# Patient Record
Sex: Female | Born: 1980 | Race: Black or African American | Hispanic: No | Marital: Single | Smoking: Former smoker
Health system: Southern US, Community
[De-identification: ages and names within clinical notes are randomized; demographics above are authoritative.]

## PROBLEM LIST (undated history)

## (undated) DIAGNOSIS — G510 Bell's palsy: Secondary | ICD-10-CM

## (undated) HISTORY — DX: Bell's palsy: G51.0

---
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
## 1999-03-05 ENCOUNTER — Encounter: Payer: Self-pay | Admitting: Emergency Medicine

## 1999-03-05 ENCOUNTER — Emergency Department (HOSPITAL_COMMUNITY): Admission: EM | Admit: 1999-03-05 | Discharge: 1999-03-05 | Payer: Self-pay | Admitting: Emergency Medicine

## 1999-06-23 ENCOUNTER — Encounter: Admission: RE | Admit: 1999-06-23 | Discharge: 1999-06-23 | Payer: Self-pay | Admitting: Family Medicine

## 1999-06-23 ENCOUNTER — Other Ambulatory Visit: Admission: RE | Admit: 1999-06-23 | Discharge: 1999-06-23 | Payer: Self-pay

## 1999-10-01 ENCOUNTER — Encounter: Admission: RE | Admit: 1999-10-01 | Discharge: 1999-10-01 | Payer: Self-pay | Admitting: Family Medicine

## 2000-08-19 ENCOUNTER — Other Ambulatory Visit: Admission: RE | Admit: 2000-08-19 | Discharge: 2000-08-19 | Payer: Self-pay | Admitting: *Deleted

## 2000-08-19 ENCOUNTER — Encounter: Admission: RE | Admit: 2000-08-19 | Discharge: 2000-08-19 | Payer: Self-pay | Admitting: Family Medicine

## 2000-08-23 ENCOUNTER — Encounter: Admission: RE | Admit: 2000-08-23 | Discharge: 2000-08-23 | Payer: Self-pay | Admitting: *Deleted

## 2000-08-23 ENCOUNTER — Encounter: Payer: Self-pay | Admitting: *Deleted

## 2000-08-31 ENCOUNTER — Encounter: Admission: RE | Admit: 2000-08-31 | Discharge: 2000-08-31 | Payer: Self-pay | Admitting: Family Medicine

## 2000-09-05 ENCOUNTER — Other Ambulatory Visit: Admission: RE | Admit: 2000-09-05 | Discharge: 2000-09-05 | Payer: Self-pay | Admitting: Family Medicine

## 2000-09-06 ENCOUNTER — Encounter: Admission: RE | Admit: 2000-09-06 | Discharge: 2000-09-06 | Payer: Self-pay | Admitting: Family Medicine

## 2000-09-21 ENCOUNTER — Encounter: Admission: RE | Admit: 2000-09-21 | Discharge: 2000-09-21 | Payer: Self-pay | Admitting: Family Medicine

## 2001-02-22 ENCOUNTER — Other Ambulatory Visit: Admission: RE | Admit: 2001-02-22 | Discharge: 2001-02-22 | Payer: Self-pay | Admitting: *Deleted

## 2001-02-22 ENCOUNTER — Encounter: Admission: RE | Admit: 2001-02-22 | Discharge: 2001-02-22 | Payer: Self-pay | Admitting: Family Medicine

## 2001-03-29 ENCOUNTER — Encounter: Admission: RE | Admit: 2001-03-29 | Discharge: 2001-03-29 | Payer: Self-pay | Admitting: Family Medicine

## 2001-04-05 ENCOUNTER — Encounter: Admission: RE | Admit: 2001-04-05 | Discharge: 2001-04-05 | Payer: Self-pay | Admitting: Family Medicine

## 2001-06-09 ENCOUNTER — Encounter: Admission: RE | Admit: 2001-06-09 | Discharge: 2001-06-09 | Payer: Self-pay | Admitting: Family Medicine

## 2001-11-08 ENCOUNTER — Ambulatory Visit (HOSPITAL_COMMUNITY): Admission: RE | Admit: 2001-11-08 | Discharge: 2001-11-08 | Payer: Self-pay | Admitting: *Deleted

## 2002-01-29 ENCOUNTER — Inpatient Hospital Stay (HOSPITAL_COMMUNITY): Admission: AD | Admit: 2002-01-29 | Discharge: 2002-01-29 | Payer: Self-pay | Admitting: *Deleted

## 2002-02-15 ENCOUNTER — Inpatient Hospital Stay (HOSPITAL_COMMUNITY): Admission: AD | Admit: 2002-02-15 | Discharge: 2002-02-15 | Payer: Self-pay | Admitting: *Deleted

## 2002-03-14 ENCOUNTER — Inpatient Hospital Stay (HOSPITAL_COMMUNITY): Admission: AD | Admit: 2002-03-14 | Discharge: 2002-03-14 | Payer: Self-pay | Admitting: Obstetrics and Gynecology

## 2002-03-23 ENCOUNTER — Inpatient Hospital Stay (HOSPITAL_COMMUNITY): Admission: AD | Admit: 2002-03-23 | Discharge: 2002-03-23 | Payer: Self-pay | Admitting: *Deleted

## 2002-03-25 ENCOUNTER — Inpatient Hospital Stay (HOSPITAL_COMMUNITY): Admission: AD | Admit: 2002-03-25 | Discharge: 2002-03-30 | Payer: Self-pay | Admitting: *Deleted

## 2002-03-26 ENCOUNTER — Encounter: Payer: Self-pay | Admitting: *Deleted

## 2002-04-02 ENCOUNTER — Encounter: Admission: RE | Admit: 2002-04-02 | Discharge: 2002-05-02 | Payer: Self-pay | Admitting: *Deleted

## 2002-05-18 ENCOUNTER — Emergency Department (HOSPITAL_COMMUNITY): Admission: EM | Admit: 2002-05-18 | Discharge: 2002-05-18 | Payer: Self-pay | Admitting: Emergency Medicine

## 2002-09-25 ENCOUNTER — Encounter: Admission: RE | Admit: 2002-09-25 | Discharge: 2002-09-25 | Payer: Self-pay | Admitting: Family Medicine

## 2002-10-29 ENCOUNTER — Encounter: Admission: RE | Admit: 2002-10-29 | Discharge: 2002-10-29 | Payer: Self-pay | Admitting: Family Medicine

## 2003-02-26 ENCOUNTER — Encounter: Admission: RE | Admit: 2003-02-26 | Discharge: 2003-02-26 | Payer: Self-pay | Admitting: Family Medicine

## 2003-03-20 ENCOUNTER — Encounter: Admission: RE | Admit: 2003-03-20 | Discharge: 2003-03-20 | Payer: Self-pay | Admitting: Family Medicine

## 2003-04-30 ENCOUNTER — Encounter: Admission: RE | Admit: 2003-04-30 | Discharge: 2003-04-30 | Payer: Self-pay | Admitting: Family Medicine

## 2003-05-08 ENCOUNTER — Ambulatory Visit (HOSPITAL_COMMUNITY): Admission: RE | Admit: 2003-05-08 | Discharge: 2003-05-08 | Payer: Self-pay | Admitting: Family Medicine

## 2003-05-31 ENCOUNTER — Encounter: Admission: RE | Admit: 2003-05-31 | Discharge: 2003-05-31 | Payer: Self-pay | Admitting: Family Medicine

## 2003-06-28 ENCOUNTER — Encounter: Admission: RE | Admit: 2003-06-28 | Discharge: 2003-06-28 | Payer: Self-pay | Admitting: Family Medicine

## 2003-08-02 ENCOUNTER — Encounter: Admission: RE | Admit: 2003-08-02 | Discharge: 2003-08-02 | Payer: Self-pay | Admitting: Family Medicine

## 2003-09-02 ENCOUNTER — Encounter: Admission: RE | Admit: 2003-09-02 | Discharge: 2003-09-02 | Payer: Self-pay | Admitting: Family Medicine

## 2003-09-17 ENCOUNTER — Encounter: Admission: RE | Admit: 2003-09-17 | Discharge: 2003-09-17 | Payer: Self-pay | Admitting: Family Medicine

## 2003-09-26 ENCOUNTER — Encounter: Admission: RE | Admit: 2003-09-26 | Discharge: 2003-09-26 | Payer: Self-pay | Admitting: Sports Medicine

## 2003-10-03 ENCOUNTER — Encounter: Admission: RE | Admit: 2003-10-03 | Discharge: 2003-10-03 | Payer: Self-pay | Admitting: Sports Medicine

## 2003-10-07 ENCOUNTER — Encounter: Admission: RE | Admit: 2003-10-07 | Discharge: 2003-10-07 | Payer: Self-pay | Admitting: Family Medicine

## 2003-10-08 ENCOUNTER — Inpatient Hospital Stay (HOSPITAL_COMMUNITY): Admission: AD | Admit: 2003-10-08 | Discharge: 2003-10-08 | Payer: Self-pay | Admitting: *Deleted

## 2003-10-08 ENCOUNTER — Encounter: Admission: RE | Admit: 2003-10-08 | Discharge: 2003-10-08 | Payer: Self-pay | Admitting: Family Medicine

## 2003-10-10 ENCOUNTER — Inpatient Hospital Stay (HOSPITAL_COMMUNITY): Admission: AD | Admit: 2003-10-10 | Discharge: 2003-10-12 | Payer: Self-pay | Admitting: Obstetrics and Gynecology

## 2004-01-07 ENCOUNTER — Encounter: Admission: RE | Admit: 2004-01-07 | Discharge: 2004-01-07 | Payer: Self-pay | Admitting: Obstetrics and Gynecology

## 2004-01-21 ENCOUNTER — Encounter: Admission: RE | Admit: 2004-01-21 | Discharge: 2004-01-21 | Payer: Self-pay | Admitting: Obstetrics & Gynecology

## 2004-11-06 ENCOUNTER — Other Ambulatory Visit: Admission: RE | Admit: 2004-11-06 | Discharge: 2004-11-06 | Payer: Self-pay | Admitting: Family Medicine

## 2004-11-06 ENCOUNTER — Ambulatory Visit: Payer: Self-pay | Admitting: Family Medicine

## 2004-11-06 ENCOUNTER — Encounter (INDEPENDENT_AMBULATORY_CARE_PROVIDER_SITE_OTHER): Payer: Self-pay | Admitting: Specialist

## 2005-02-24 ENCOUNTER — Ambulatory Visit: Payer: Self-pay | Admitting: Family Medicine

## 2005-05-07 ENCOUNTER — Ambulatory Visit: Payer: Self-pay | Admitting: Family Medicine

## 2005-05-07 ENCOUNTER — Encounter (INDEPENDENT_AMBULATORY_CARE_PROVIDER_SITE_OTHER): Payer: Self-pay | Admitting: Specialist

## 2005-05-07 ENCOUNTER — Other Ambulatory Visit: Admission: RE | Admit: 2005-05-07 | Discharge: 2005-05-07 | Payer: Self-pay | Admitting: Family Medicine

## 2005-11-24 ENCOUNTER — Ambulatory Visit: Payer: Self-pay | Admitting: Family Medicine

## 2005-11-29 ENCOUNTER — Ambulatory Visit (HOSPITAL_COMMUNITY): Admission: RE | Admit: 2005-11-29 | Discharge: 2005-11-29 | Payer: Self-pay | Admitting: *Deleted

## 2006-01-07 ENCOUNTER — Ambulatory Visit: Payer: Self-pay | Admitting: Sports Medicine

## 2006-02-03 ENCOUNTER — Ambulatory Visit: Payer: Self-pay | Admitting: Family Medicine

## 2006-02-15 ENCOUNTER — Ambulatory Visit: Payer: Self-pay | Admitting: Sports Medicine

## 2006-02-17 ENCOUNTER — Ambulatory Visit: Payer: Self-pay | Admitting: Family Medicine

## 2006-03-21 ENCOUNTER — Ambulatory Visit: Payer: Self-pay | Admitting: Family Medicine

## 2006-04-18 IMAGING — US US TRANSVAGINAL NON-OB
1 series · 18 of 25 positions shown · non-contrast
Comparison: none

CLINICAL DATA: Evaluate IUD placement.
TRANSABDOMINAL AND ENDOVAGINAL PELVIC ULTRASOUND:

[Series 1: us transvaginal non-ob · 18 of 47 slices shown]
[im 1/47]
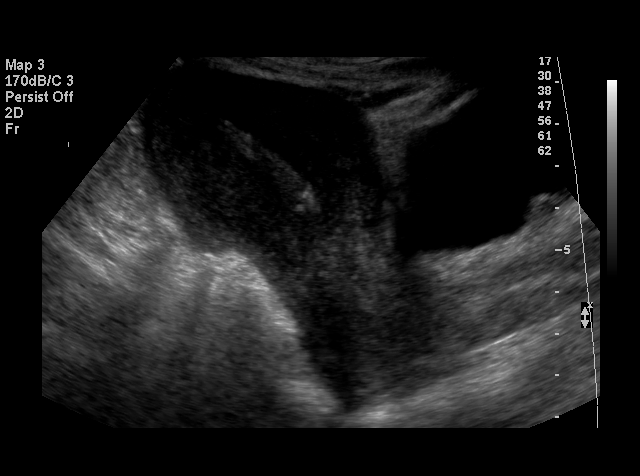
[im 4/47]
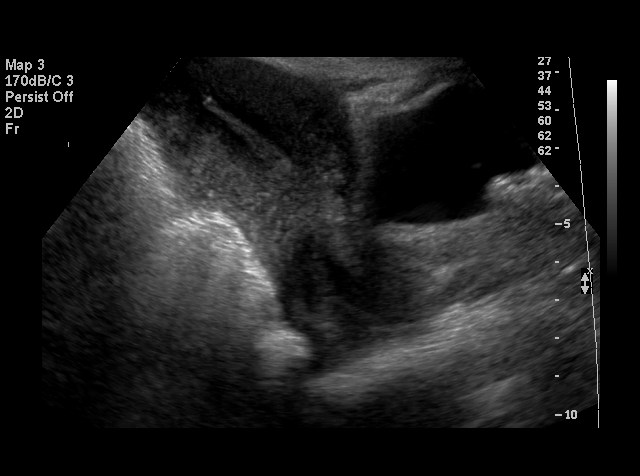
[im 6/47]
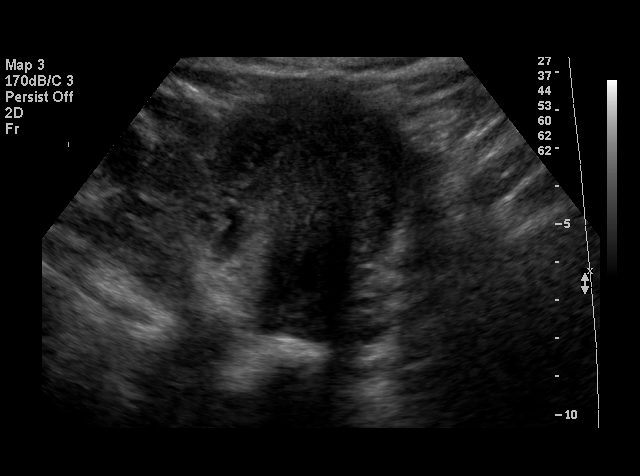
[im 8/47]
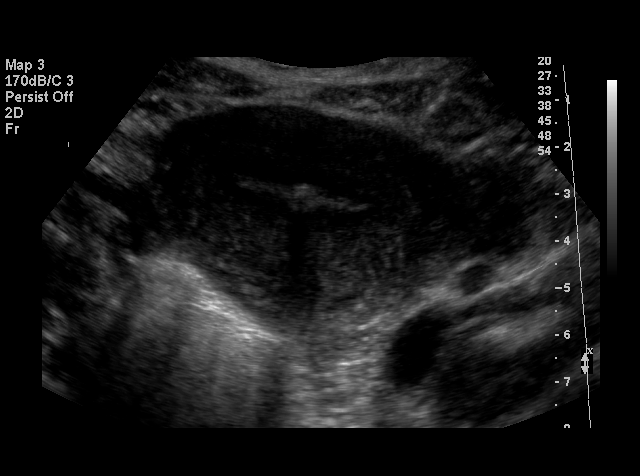
[im 12/47]
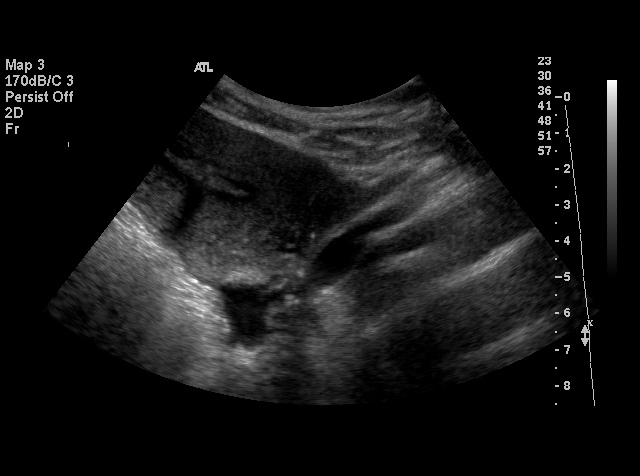
[im 14/47]
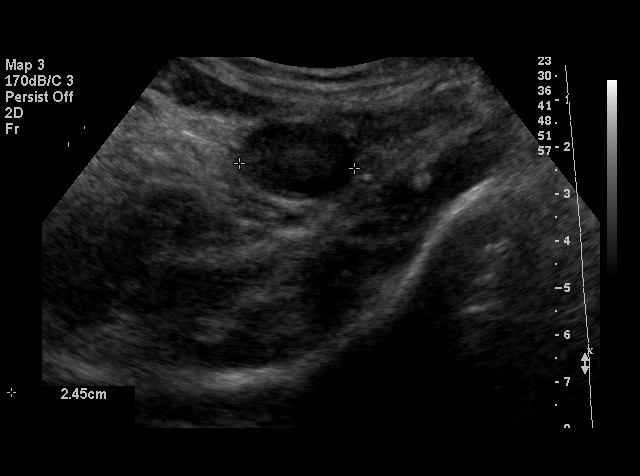
[im 18/47]
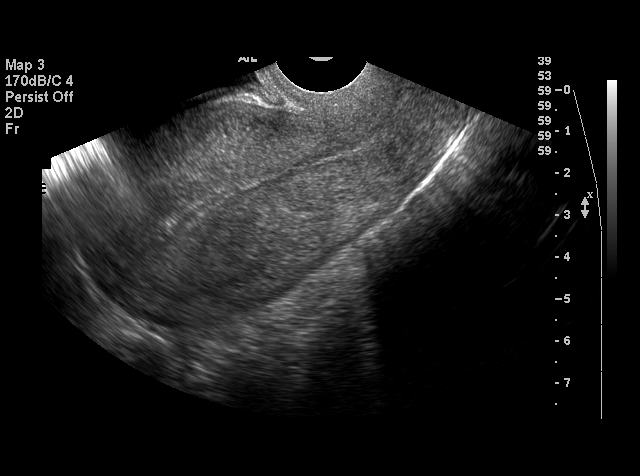
[im 20/47]
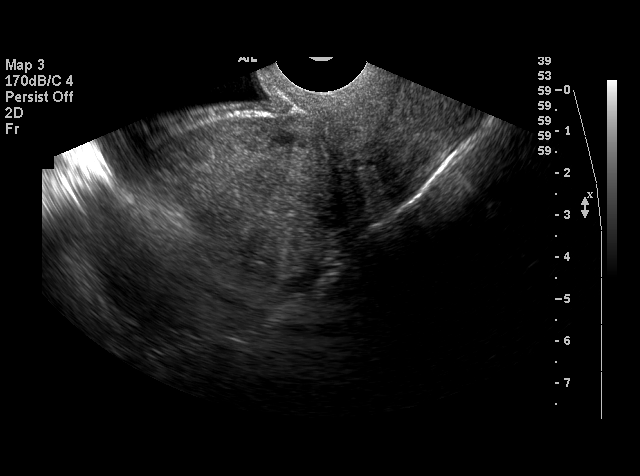
[im 22/47]
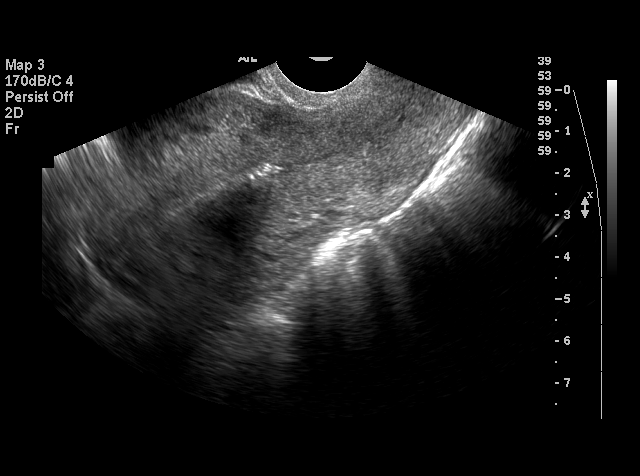
[im 25/47]
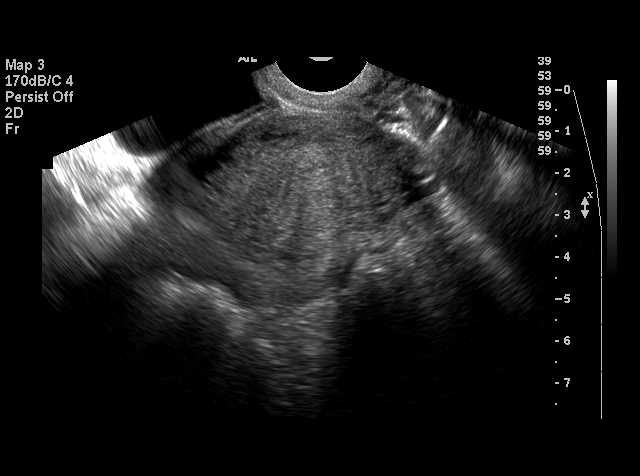
[im 27/47]
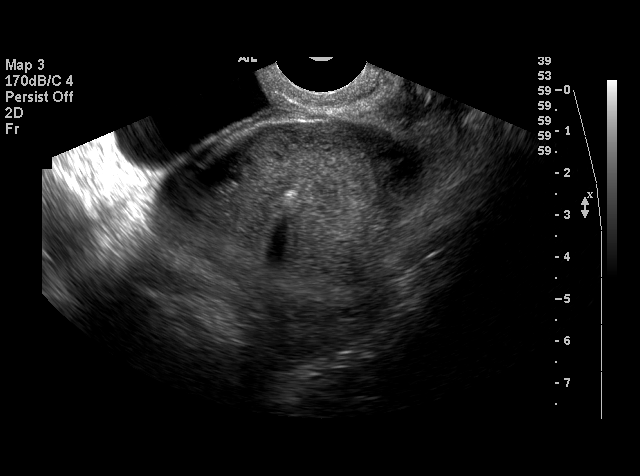
[im 29/47]
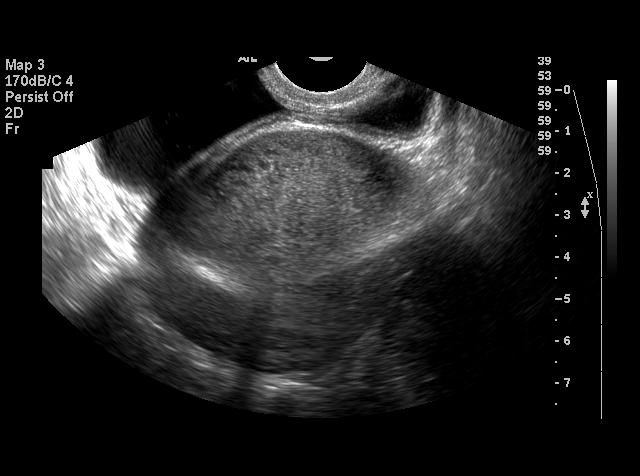
[im 33/47]
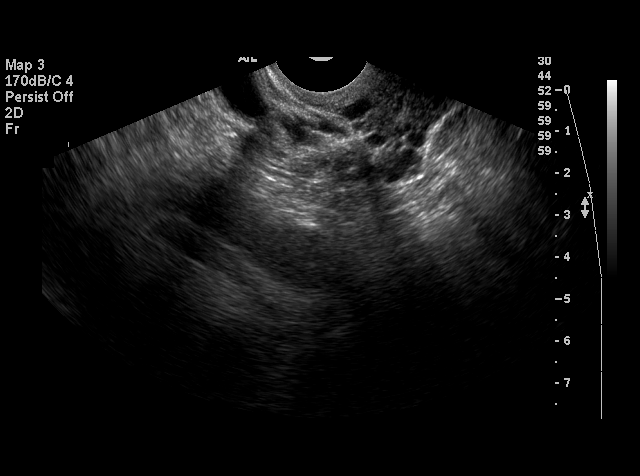
[im 35/47]
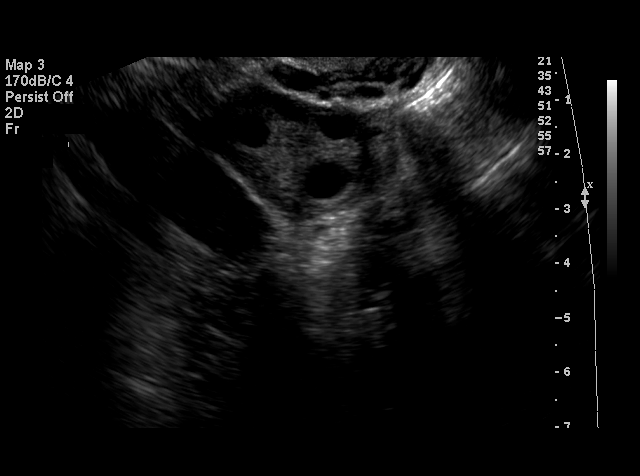
[im 39/47]
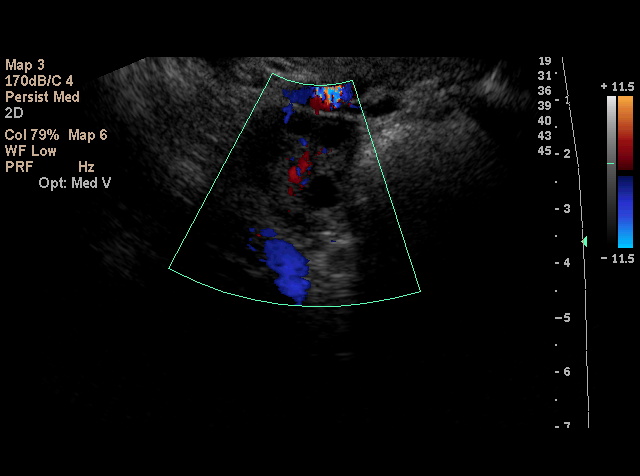
[im 41/47]
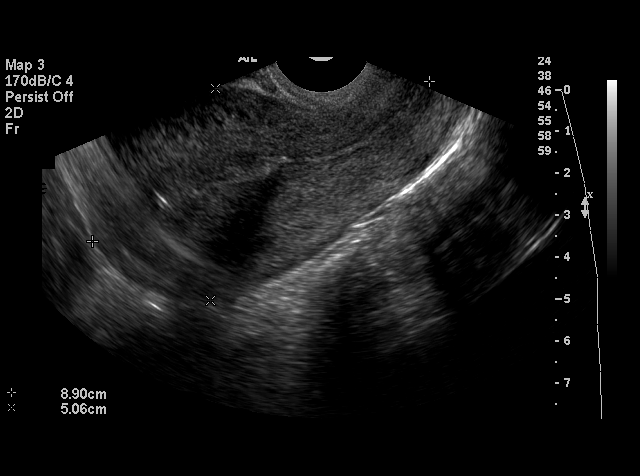
[im 43/47]
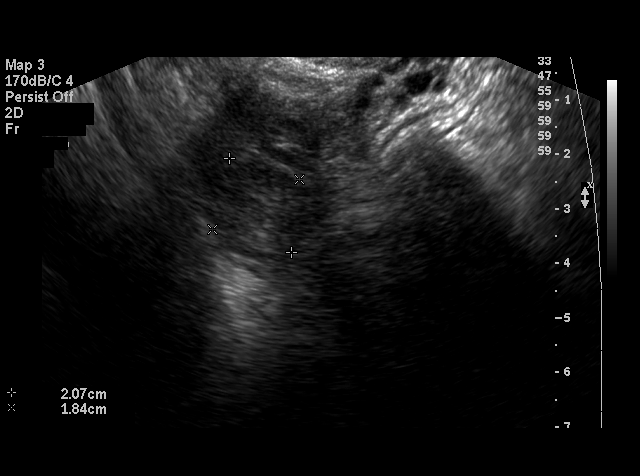
[im 47/47]
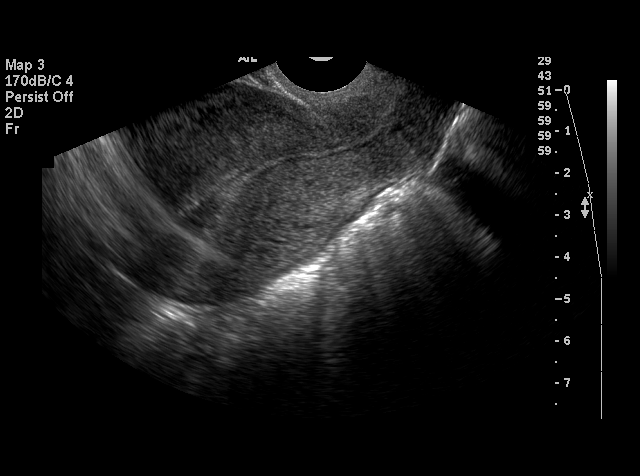

[18 of 25 positions shown; findings below may reference images not displayed]

FINDINGS: Multiple images of the uterus and adnexa were obtained using a transabdominal and endovaginal approaches.
FINDINGS: The uterus has a sagittal length of 8.9 cm, AP width of 5.1 cm and a transverse width of 5.7 cm.  A homogeneous uterine myometrium is seen.   Within the central fundal portion of the endometrial canal there is an IUD in place and this obscures evaluation of this portion of the endometrium.  The lateral fundal aspect of the endometrium measures 6.6 mm in AP width.  No areas of focal thickening or inhomogeniety are suggested.
Both ovaries are seen and have a normal appearance with the left ovary measuring 2.7 x 1.9 x 2.5 cm and the right ovary measuring 1.8 x 3.0 x 2.0 cm. No cul-de-sac or periovarian fluid is seen and no separate adnexal masses are noted.
IMPRESSION: Normal uterus and ovaries.  The patient?s IUD is in good position within the endometrial canal.  No definite endometrial abnormalities are apparent.

## 2006-04-21 ENCOUNTER — Emergency Department (HOSPITAL_COMMUNITY): Admission: EM | Admit: 2006-04-21 | Discharge: 2006-04-21 | Payer: Self-pay | Admitting: Family Medicine

## 2006-05-06 ENCOUNTER — Ambulatory Visit: Payer: Self-pay | Admitting: Family Medicine

## 2006-06-07 ENCOUNTER — Emergency Department (HOSPITAL_COMMUNITY): Admission: EM | Admit: 2006-06-07 | Discharge: 2006-06-07 | Payer: Self-pay | Admitting: Emergency Medicine

## 2006-06-15 ENCOUNTER — Ambulatory Visit: Payer: Self-pay | Admitting: Sports Medicine

## 2006-07-23 ENCOUNTER — Encounter (INDEPENDENT_AMBULATORY_CARE_PROVIDER_SITE_OTHER): Payer: Self-pay | Admitting: *Deleted

## 2006-08-01 ENCOUNTER — Other Ambulatory Visit: Admission: RE | Admit: 2006-08-01 | Discharge: 2006-08-01 | Payer: Self-pay | Admitting: Family Medicine

## 2006-08-01 ENCOUNTER — Ambulatory Visit: Payer: Self-pay | Admitting: Sports Medicine

## 2006-08-01 ENCOUNTER — Encounter (INDEPENDENT_AMBULATORY_CARE_PROVIDER_SITE_OTHER): Payer: Self-pay | Admitting: *Deleted

## 2006-08-03 ENCOUNTER — Ambulatory Visit: Payer: Self-pay | Admitting: Sports Medicine

## 2006-08-09 ENCOUNTER — Ambulatory Visit: Payer: Self-pay | Admitting: Family Medicine

## 2006-09-08 ENCOUNTER — Emergency Department (HOSPITAL_COMMUNITY): Admission: EM | Admit: 2006-09-08 | Discharge: 2006-09-08 | Payer: Self-pay | Admitting: Family Medicine

## 2006-09-12 ENCOUNTER — Ambulatory Visit: Payer: Self-pay | Admitting: Family Medicine

## 2006-10-28 ENCOUNTER — Ambulatory Visit: Payer: Self-pay | Admitting: Family Medicine

## 2006-12-26 ENCOUNTER — Ambulatory Visit: Payer: Self-pay | Admitting: Family Medicine

## 2007-01-20 ENCOUNTER — Encounter (INDEPENDENT_AMBULATORY_CARE_PROVIDER_SITE_OTHER): Payer: Self-pay | Admitting: *Deleted

## 2007-01-25 ENCOUNTER — Ambulatory Visit: Payer: Self-pay | Admitting: Family Medicine

## 2007-04-24 ENCOUNTER — Telehealth: Payer: Self-pay | Admitting: *Deleted

## 2007-05-08 ENCOUNTER — Ambulatory Visit: Payer: Self-pay | Admitting: Family Medicine

## 2007-05-08 ENCOUNTER — Telehealth: Payer: Self-pay | Admitting: *Deleted

## 2007-05-12 ENCOUNTER — Ambulatory Visit: Payer: Self-pay | Admitting: Family Medicine

## 2007-05-12 ENCOUNTER — Encounter (INDEPENDENT_AMBULATORY_CARE_PROVIDER_SITE_OTHER): Payer: Self-pay | Admitting: Family Medicine

## 2007-05-12 LAB — CONVERTED CEMR LAB
Basophils Absolute: 0 10*3/uL (ref 0.0–0.1)
Basophils Relative: 0 % (ref 0–1)
Beta hcg, urine, semiquantitative: POSITIVE
Eosinophils Absolute: 0.2 10*3/uL (ref 0.0–0.7)
Hepatitis B Surface Ag: NEGATIVE
MCHC: 34.2 g/dL (ref 30.0–36.0)
MCV: 90.2 fL (ref 78.0–100.0)
Neutro Abs: 5.6 10*3/uL (ref 1.7–7.7)
Neutrophils Relative %: 73 % (ref 43–77)
Platelets: 262 10*3/uL (ref 150–400)
Rubella: 52.7 intl units/mL — ABNORMAL HIGH

## 2007-05-19 ENCOUNTER — Telehealth (INDEPENDENT_AMBULATORY_CARE_PROVIDER_SITE_OTHER): Payer: Self-pay | Admitting: *Deleted

## 2007-06-05 ENCOUNTER — Encounter (INDEPENDENT_AMBULATORY_CARE_PROVIDER_SITE_OTHER): Payer: Self-pay | Admitting: Family Medicine

## 2007-06-05 ENCOUNTER — Other Ambulatory Visit: Admission: RE | Admit: 2007-06-05 | Discharge: 2007-06-05 | Payer: Self-pay | Admitting: Family Medicine

## 2007-06-05 ENCOUNTER — Ambulatory Visit: Payer: Self-pay | Admitting: Family Medicine

## 2007-06-05 LAB — CONVERTED CEMR LAB: GC Probe Amp, Genital: NEGATIVE

## 2007-06-06 ENCOUNTER — Encounter (INDEPENDENT_AMBULATORY_CARE_PROVIDER_SITE_OTHER): Payer: Self-pay | Admitting: Family Medicine

## 2007-06-06 LAB — CONVERTED CEMR LAB: Pap Smear: NORMAL

## 2007-06-12 ENCOUNTER — Ambulatory Visit (HOSPITAL_COMMUNITY): Admission: RE | Admit: 2007-06-12 | Discharge: 2007-06-12 | Payer: Self-pay | Admitting: Sports Medicine

## 2007-06-14 ENCOUNTER — Encounter (INDEPENDENT_AMBULATORY_CARE_PROVIDER_SITE_OTHER): Payer: Self-pay | Admitting: Family Medicine

## 2007-06-15 ENCOUNTER — Telehealth (INDEPENDENT_AMBULATORY_CARE_PROVIDER_SITE_OTHER): Payer: Self-pay | Admitting: *Deleted

## 2007-06-29 ENCOUNTER — Encounter: Payer: Self-pay | Admitting: *Deleted

## 2007-06-29 ENCOUNTER — Ambulatory Visit: Payer: Self-pay | Admitting: Family Medicine

## 2007-06-29 ENCOUNTER — Encounter (INDEPENDENT_AMBULATORY_CARE_PROVIDER_SITE_OTHER): Payer: Self-pay | Admitting: Family Medicine

## 2007-06-29 ENCOUNTER — Ambulatory Visit (HOSPITAL_COMMUNITY): Admission: RE | Admit: 2007-06-29 | Discharge: 2007-06-29 | Payer: Self-pay | Admitting: Sports Medicine

## 2007-06-29 LAB — CONVERTED CEMR LAB
Chlamydia, DNA Probe: NEGATIVE
GC Probe Amp, Genital: NEGATIVE
Glucose, Urine, Semiquant: NEGATIVE
KOH Prep: NEGATIVE
Protein, U semiquant: 30
WBC Urine, dipstick: NEGATIVE
Whiff Test: NEGATIVE
pH: 7

## 2007-06-30 ENCOUNTER — Ambulatory Visit: Payer: Self-pay | Admitting: Family Medicine

## 2007-06-30 ENCOUNTER — Telehealth (INDEPENDENT_AMBULATORY_CARE_PROVIDER_SITE_OTHER): Payer: Self-pay | Admitting: Family Medicine

## 2007-06-30 ENCOUNTER — Encounter: Payer: Self-pay | Admitting: Family Medicine

## 2007-07-03 ENCOUNTER — Encounter (INDEPENDENT_AMBULATORY_CARE_PROVIDER_SITE_OTHER): Payer: Self-pay | Admitting: Family Medicine

## 2007-07-11 ENCOUNTER — Ambulatory Visit: Payer: Self-pay | Admitting: Family Medicine

## 2007-07-12 ENCOUNTER — Telehealth (INDEPENDENT_AMBULATORY_CARE_PROVIDER_SITE_OTHER): Payer: Self-pay | Admitting: Family Medicine

## 2007-07-14 ENCOUNTER — Encounter (INDEPENDENT_AMBULATORY_CARE_PROVIDER_SITE_OTHER): Payer: Self-pay | Admitting: Family Medicine

## 2007-07-25 ENCOUNTER — Ambulatory Visit (HOSPITAL_COMMUNITY): Admission: RE | Admit: 2007-07-25 | Discharge: 2007-07-25 | Payer: Self-pay | Admitting: Vascular Surgery

## 2007-07-25 ENCOUNTER — Encounter (INDEPENDENT_AMBULATORY_CARE_PROVIDER_SITE_OTHER): Payer: Self-pay | Admitting: Family Medicine

## 2007-08-01 ENCOUNTER — Ambulatory Visit: Payer: Self-pay | Admitting: Family Medicine

## 2007-08-08 ENCOUNTER — Telehealth: Payer: Self-pay | Admitting: *Deleted

## 2007-09-07 ENCOUNTER — Ambulatory Visit: Payer: Self-pay | Admitting: Family Medicine

## 2007-09-07 LAB — CONVERTED CEMR LAB: GTT, 1 hr: 121 mg/dL

## 2007-10-12 ENCOUNTER — Ambulatory Visit: Payer: Self-pay | Admitting: Family Medicine

## 2007-10-13 ENCOUNTER — Telehealth (INDEPENDENT_AMBULATORY_CARE_PROVIDER_SITE_OTHER): Payer: Self-pay | Admitting: Family Medicine

## 2007-10-18 ENCOUNTER — Inpatient Hospital Stay (HOSPITAL_COMMUNITY): Admission: AD | Admit: 2007-10-18 | Discharge: 2007-10-18 | Payer: Self-pay | Admitting: Obstetrics & Gynecology

## 2007-10-18 ENCOUNTER — Ambulatory Visit: Payer: Self-pay | Admitting: *Deleted

## 2007-10-30 IMAGING — US US OB NUCHAL TRANSLUCENCY 1ST GEST
1 series · 14 of 28 positions shown · non-contrast
Comparison: none

OBSTETRICAL ULTRASOUND:
 This ultrasound was performed in The [HOSPITAL], and the AS OB/GYN report will be stored to [REDACTED] PACS.

[Series 1: us ob nuchal translucency 1st gest · 14 of 30 slices shown]
[im 2/30]
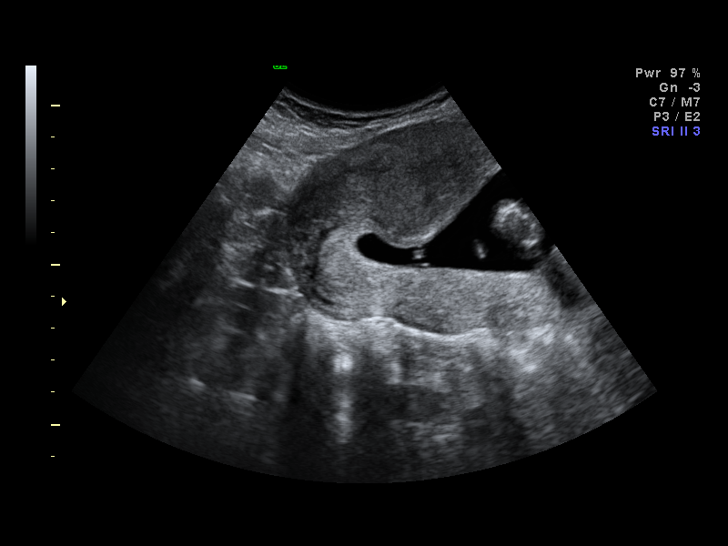
[im 4/30]
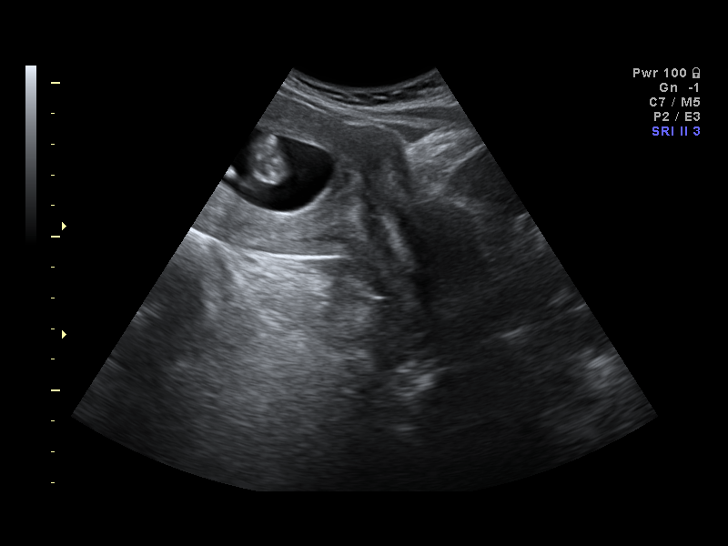
[im 6/30]
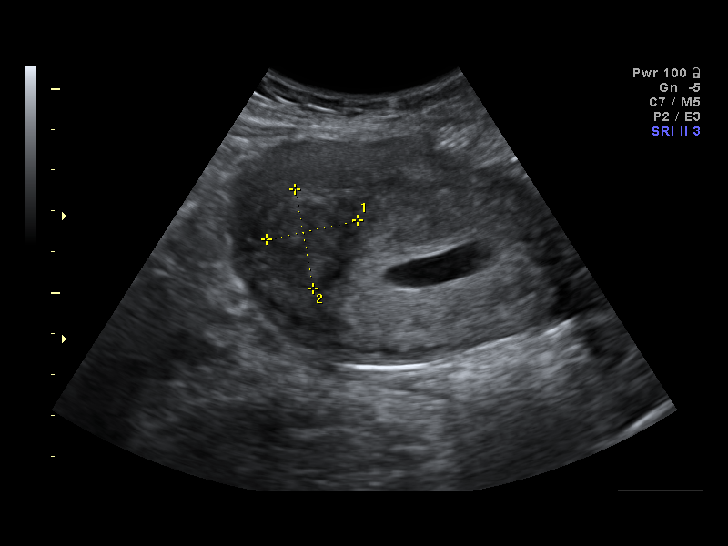
[im 8/30]
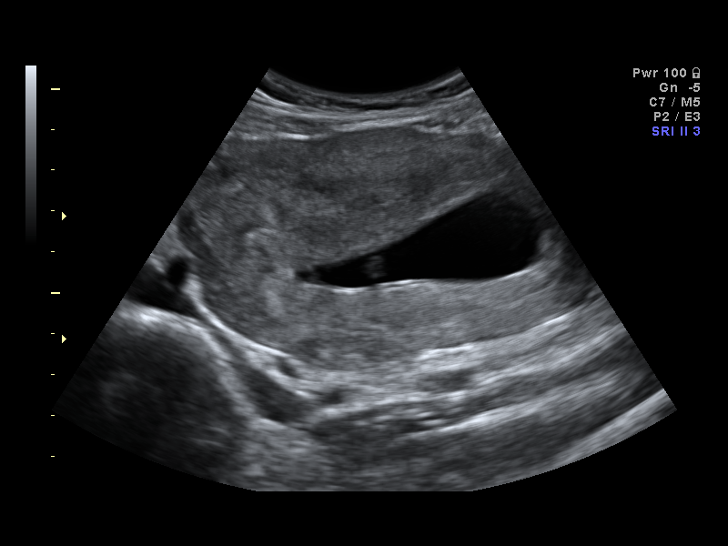
[im 10/30]
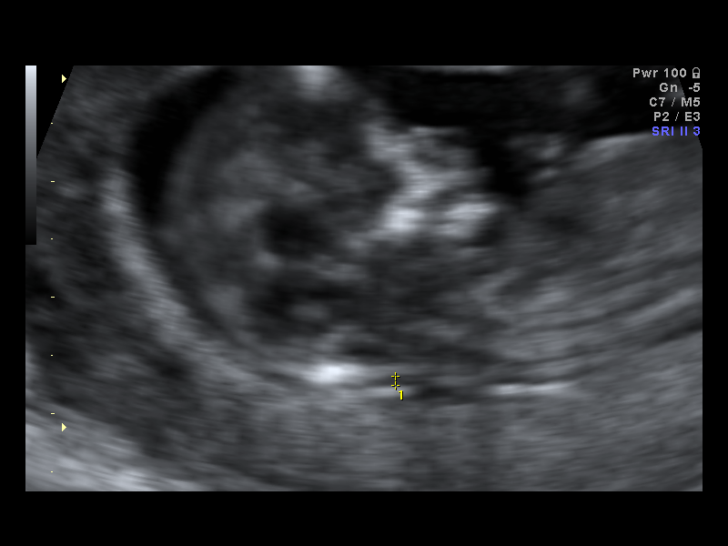
[im 12/30]
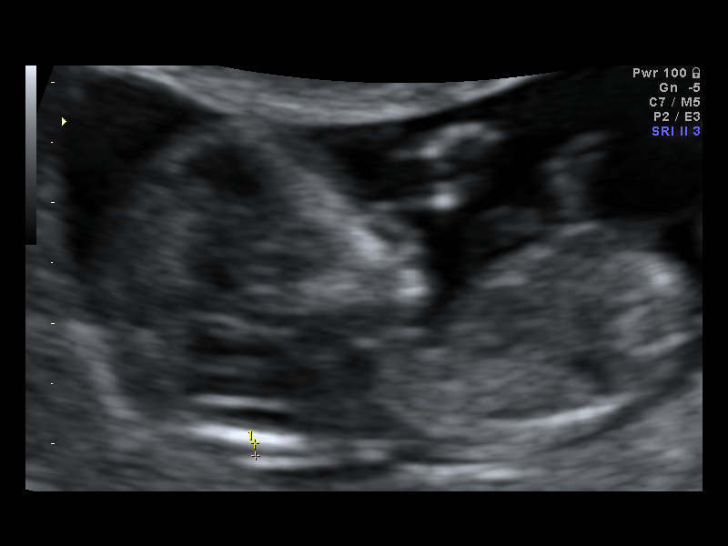
[im 14/30]
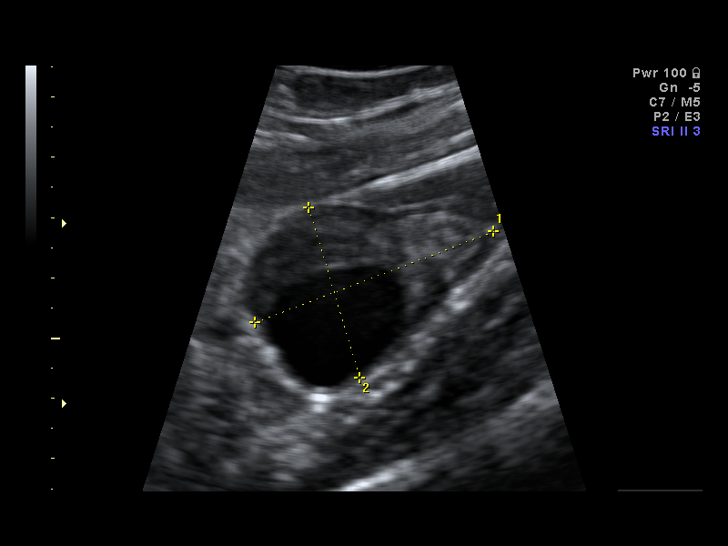
[im 17/30]
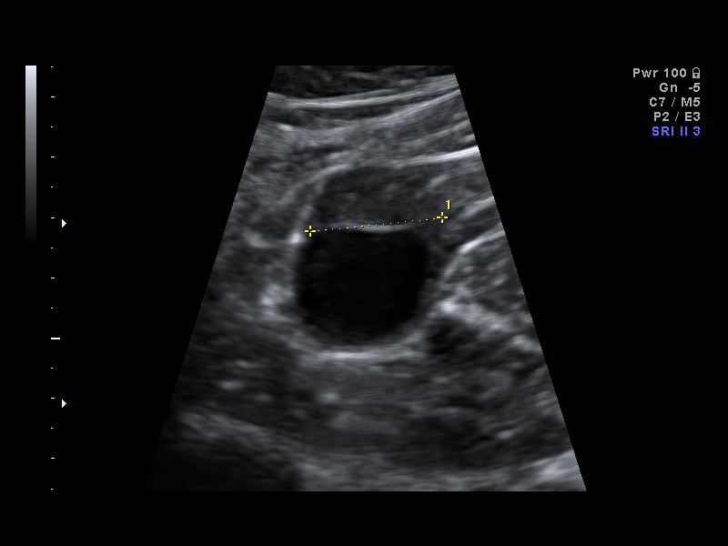
[im 19/30]
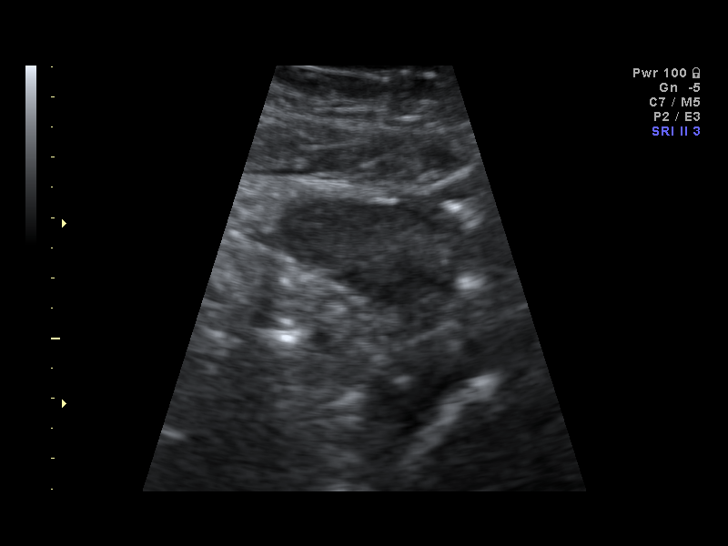
[im 21/30]
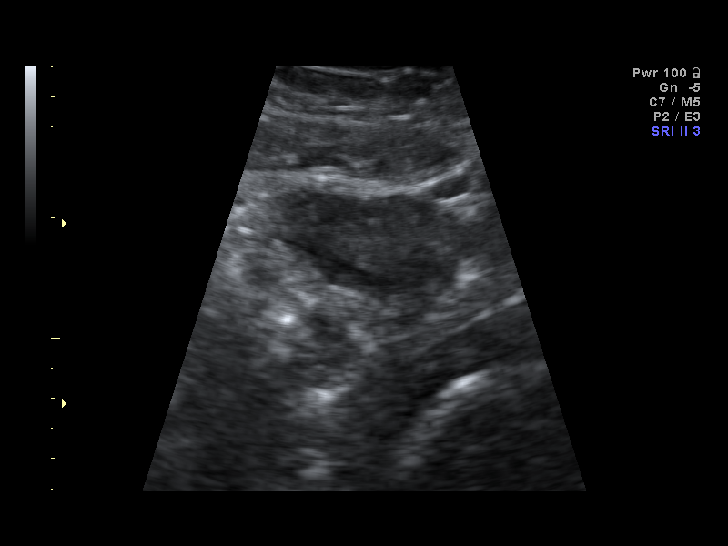
[im 23/30]
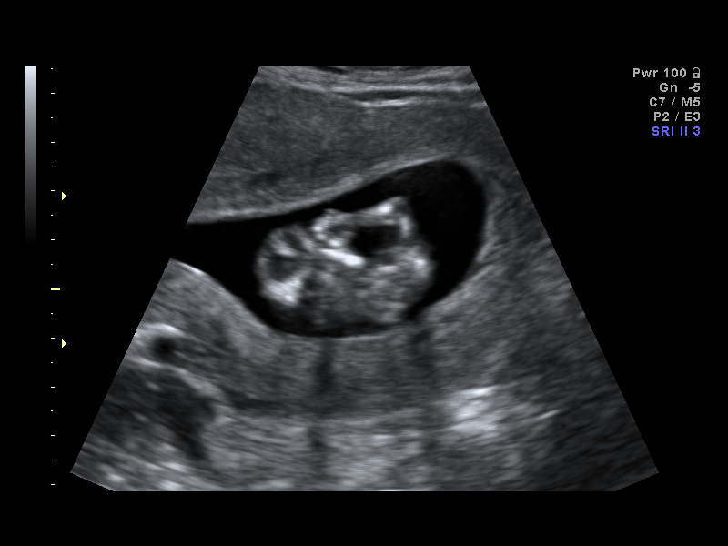
[im 25/30]
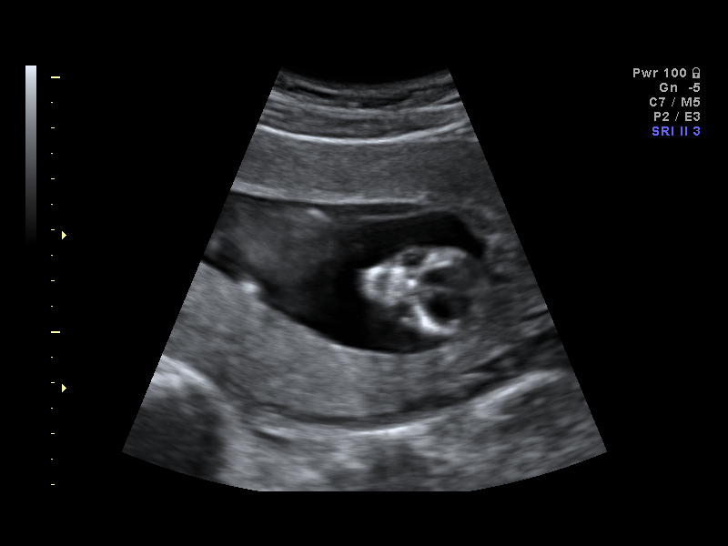
[im 27/30]
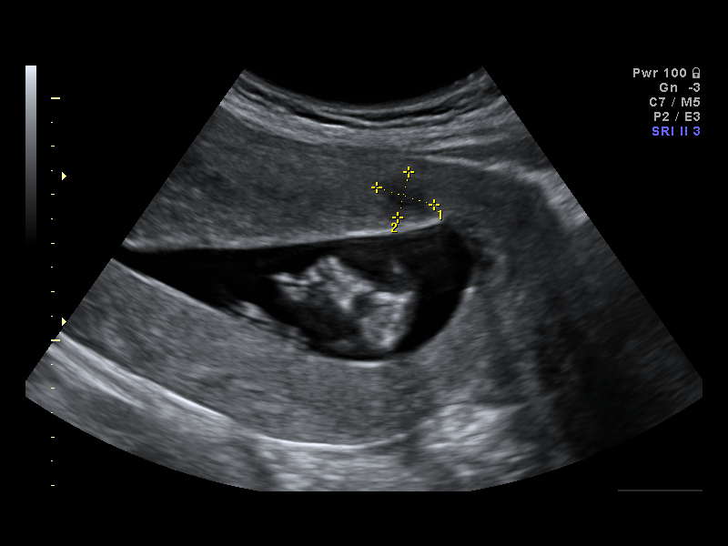
[im 30/30]
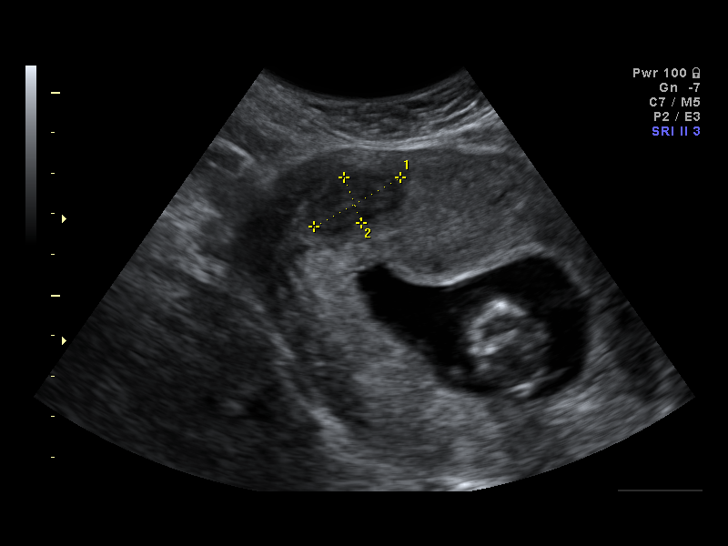

[14 of 28 positions shown; findings below may reference images not displayed]

IMPRESSION: The AS OB/GYN report has also been faxed to the ordering physician.

## 2007-11-07 ENCOUNTER — Ambulatory Visit: Payer: Self-pay | Admitting: Family Medicine

## 2007-11-17 ENCOUNTER — Telehealth (INDEPENDENT_AMBULATORY_CARE_PROVIDER_SITE_OTHER): Payer: Self-pay | Admitting: *Deleted

## 2007-11-23 HISTORY — PX: TUBAL LIGATION: SHX77

## 2007-11-27 ENCOUNTER — Encounter (INDEPENDENT_AMBULATORY_CARE_PROVIDER_SITE_OTHER): Payer: Self-pay | Admitting: Family Medicine

## 2007-11-27 ENCOUNTER — Ambulatory Visit: Payer: Self-pay | Admitting: Sports Medicine

## 2007-11-27 LAB — CONVERTED CEMR LAB: GC Probe Amp, Genital: NEGATIVE

## 2007-11-28 ENCOUNTER — Encounter (INDEPENDENT_AMBULATORY_CARE_PROVIDER_SITE_OTHER): Payer: Self-pay | Admitting: Family Medicine

## 2007-12-01 ENCOUNTER — Ambulatory Visit: Payer: Self-pay | Admitting: Physician Assistant

## 2007-12-01 ENCOUNTER — Inpatient Hospital Stay (HOSPITAL_COMMUNITY): Admission: AD | Admit: 2007-12-01 | Discharge: 2007-12-01 | Payer: Self-pay | Admitting: Gynecology

## 2007-12-05 ENCOUNTER — Ambulatory Visit: Payer: Self-pay | Admitting: Family Medicine

## 2007-12-05 ENCOUNTER — Encounter (INDEPENDENT_AMBULATORY_CARE_PROVIDER_SITE_OTHER): Payer: Self-pay | Admitting: Family Medicine

## 2007-12-05 LAB — CONVERTED CEMR LAB

## 2007-12-07 ENCOUNTER — Inpatient Hospital Stay (HOSPITAL_COMMUNITY): Admission: AD | Admit: 2007-12-07 | Discharge: 2007-12-07 | Payer: Self-pay | Admitting: Obstetrics & Gynecology

## 2007-12-07 ENCOUNTER — Ambulatory Visit: Payer: Self-pay | Admitting: Advanced Practice Midwife

## 2007-12-09 ENCOUNTER — Ambulatory Visit: Payer: Self-pay | Admitting: Obstetrics & Gynecology

## 2007-12-09 ENCOUNTER — Inpatient Hospital Stay (HOSPITAL_COMMUNITY): Admission: AD | Admit: 2007-12-09 | Discharge: 2007-12-09 | Payer: Self-pay | Admitting: Obstetrics & Gynecology

## 2007-12-10 ENCOUNTER — Inpatient Hospital Stay (HOSPITAL_COMMUNITY): Admission: AD | Admit: 2007-12-10 | Discharge: 2007-12-10 | Payer: Self-pay | Admitting: Obstetrics & Gynecology

## 2007-12-12 IMAGING — US US OB FOLLOW-UP
1 series · 14 of 28 positions shown · non-contrast
Comparison: none

OBSTETRICAL ULTRASOUND:

 This ultrasound exam was performed in the [HOSPITAL] Ultrasound Department.  The OB US report was generated in the AS system, and faxed to the ordering physician.  This report is also available in [REDACTED] PACS.

[Series 1: us ob follow-up · 0.26mm/px · 14 of 70 slices shown]
[im 3/70]
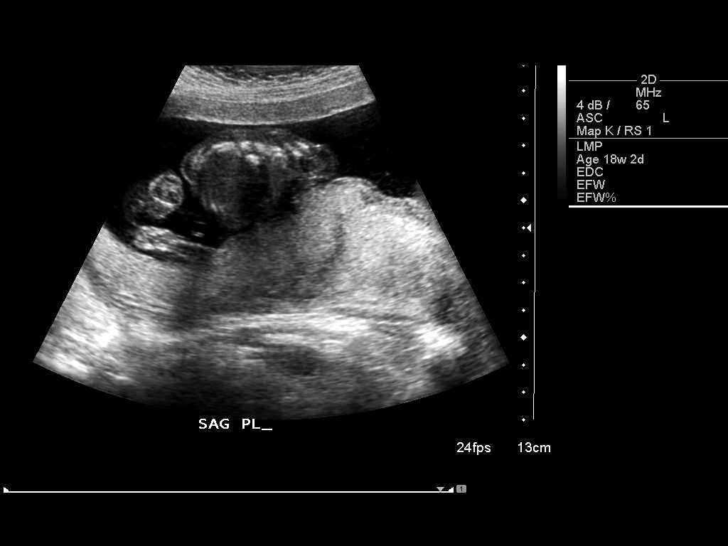
[im 8/70]
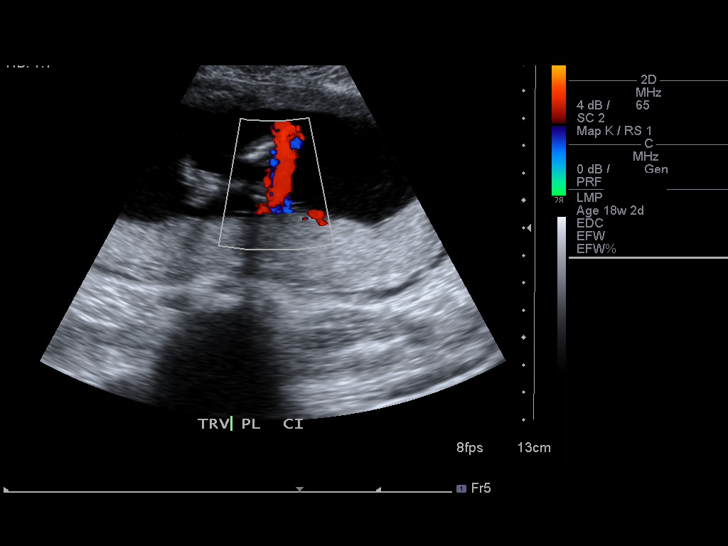
[im 13/70]
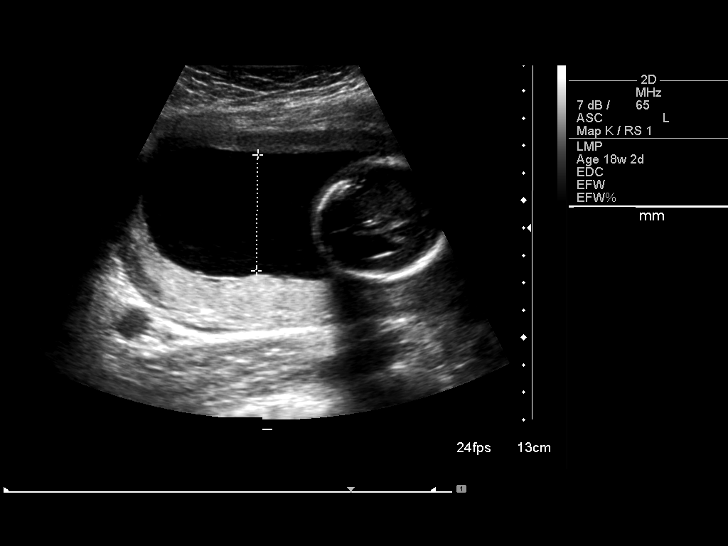
[im 18/70]
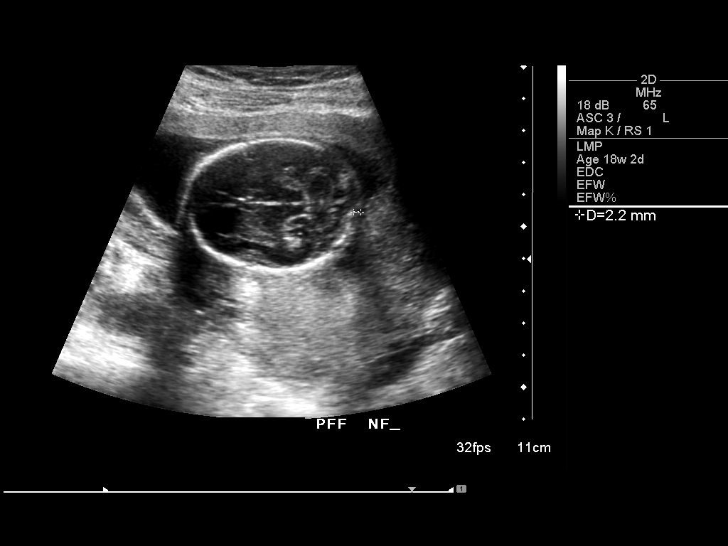
[im 24/70]
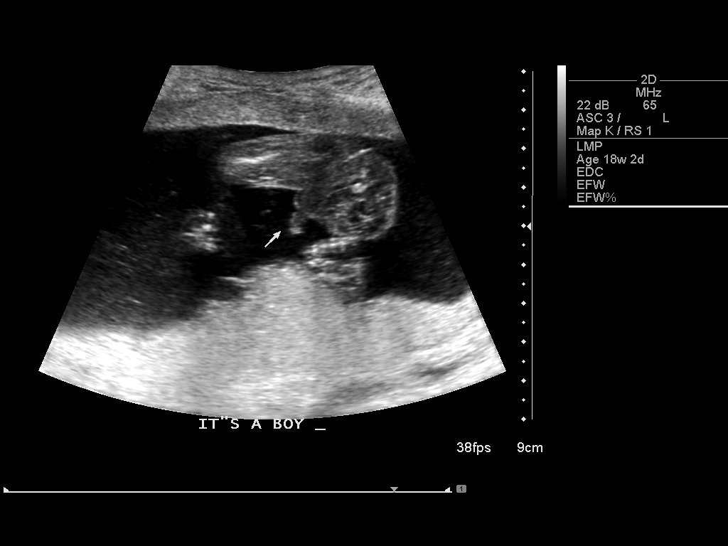
[im 29/70]
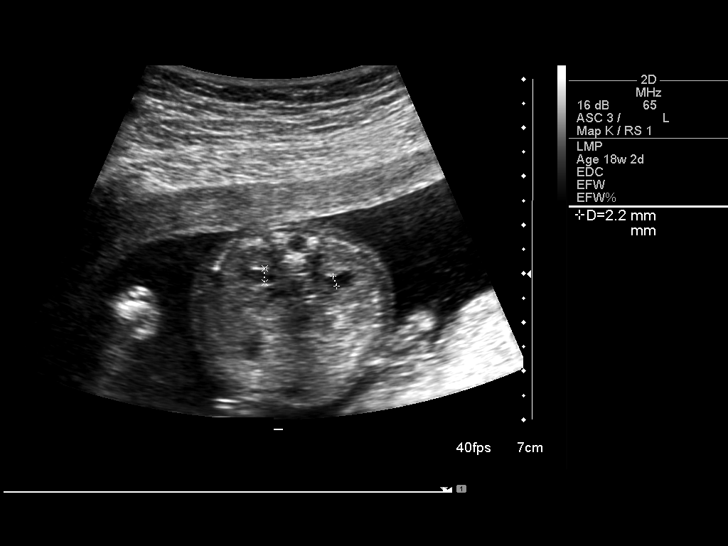
[im 34/70]
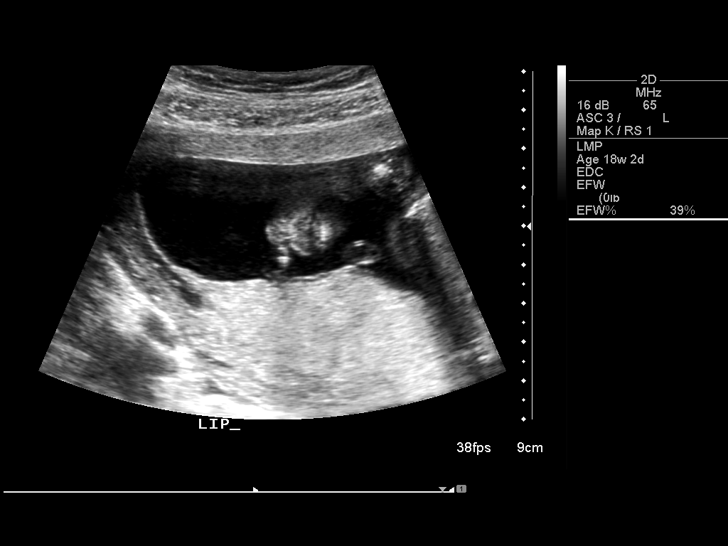
[im 39/70]
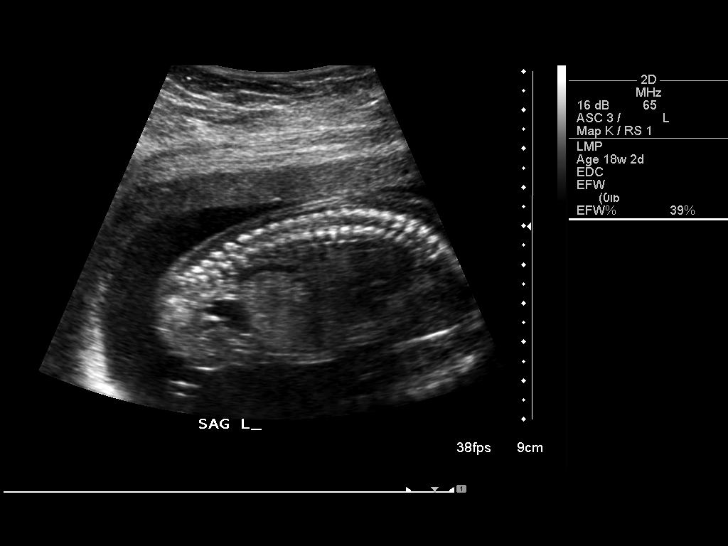
[im 44/70]
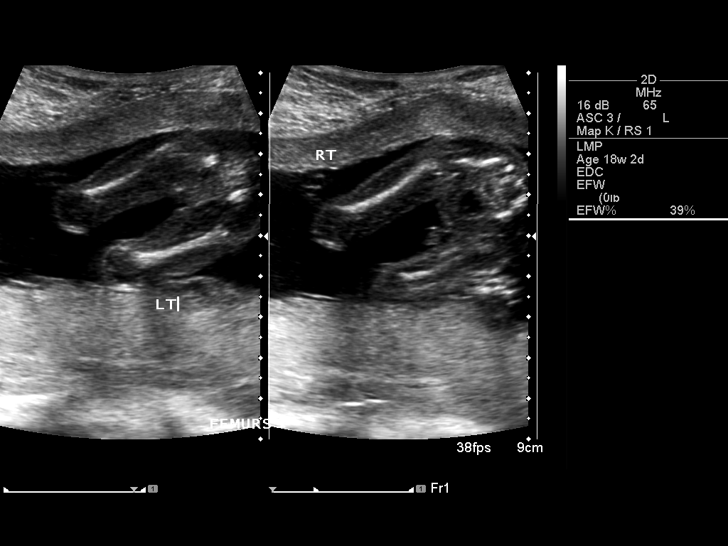
[im 49/70]
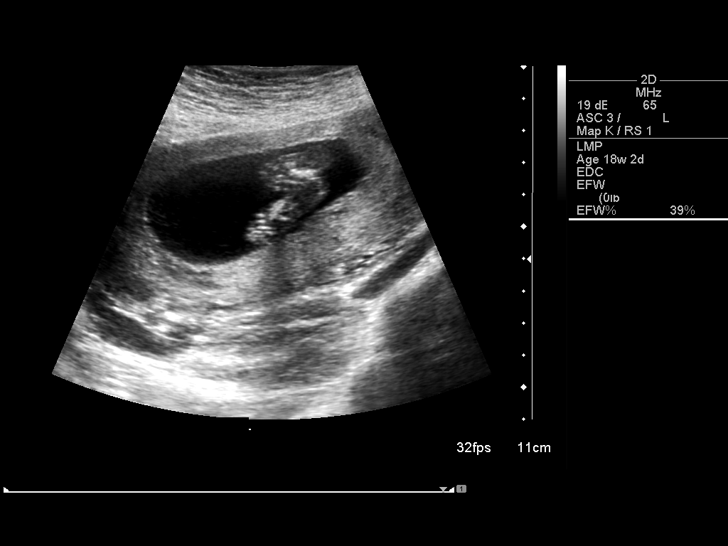
[im 54/70]
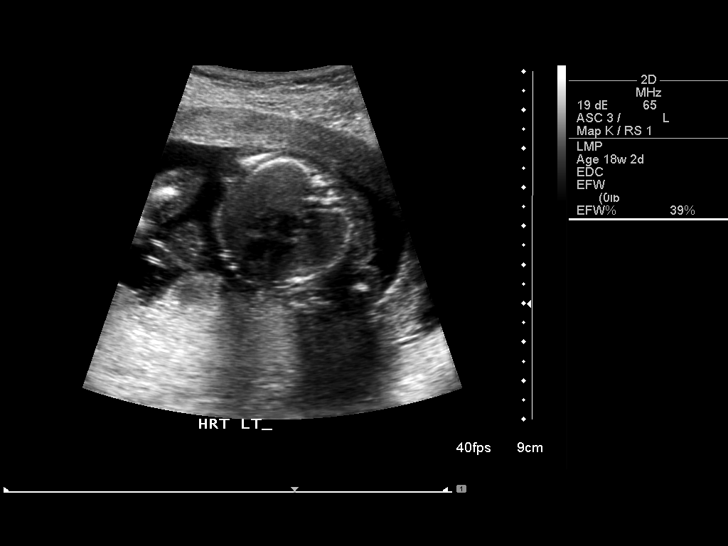
[im 59/70]
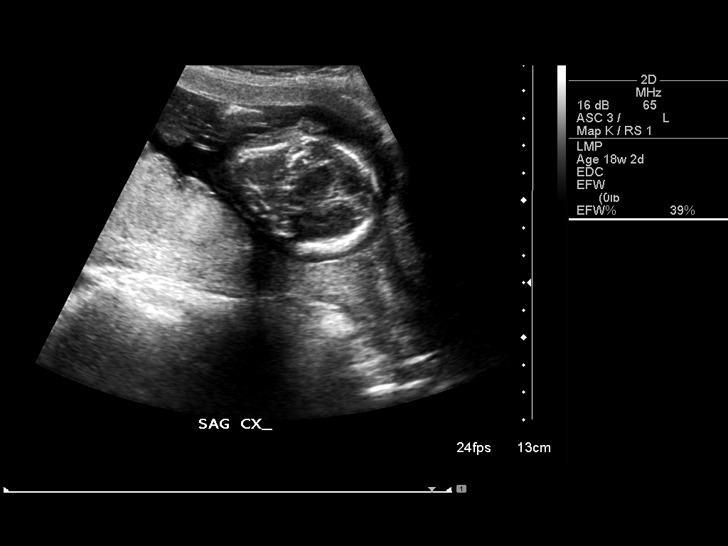
[im 64/70]
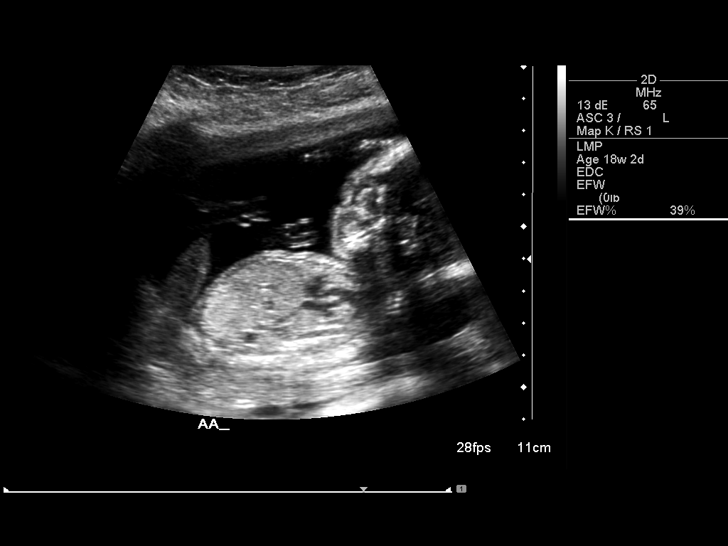
[im 70/70]
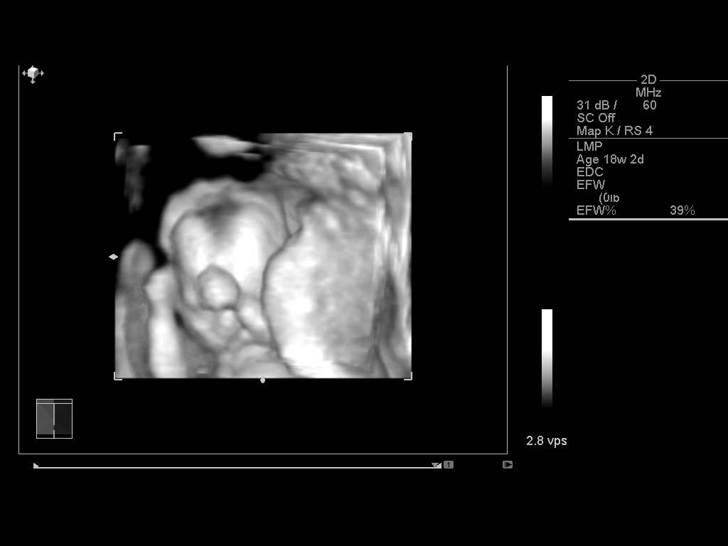

[14 of 28 positions shown; findings below may reference images not displayed]

IMPRESSION: See AS Obstetric US report.

## 2007-12-13 ENCOUNTER — Ambulatory Visit: Payer: Self-pay | Admitting: Family Medicine

## 2007-12-13 ENCOUNTER — Inpatient Hospital Stay (HOSPITAL_COMMUNITY): Admission: AD | Admit: 2007-12-13 | Discharge: 2007-12-13 | Payer: Self-pay | Admitting: Obstetrics & Gynecology

## 2007-12-13 ENCOUNTER — Ambulatory Visit: Payer: Self-pay | Admitting: *Deleted

## 2007-12-13 LAB — CONVERTED CEMR LAB: Protein, U semiquant: NEGATIVE

## 2007-12-15 ENCOUNTER — Telehealth (INDEPENDENT_AMBULATORY_CARE_PROVIDER_SITE_OTHER): Payer: Self-pay | Admitting: Family Medicine

## 2007-12-19 ENCOUNTER — Inpatient Hospital Stay (HOSPITAL_COMMUNITY): Admission: AD | Admit: 2007-12-19 | Discharge: 2007-12-22 | Payer: Self-pay | Admitting: Obstetrics & Gynecology

## 2007-12-19 ENCOUNTER — Ambulatory Visit: Payer: Self-pay | Admitting: Family Medicine

## 2007-12-19 ENCOUNTER — Ambulatory Visit: Payer: Self-pay | Admitting: Gynecology

## 2007-12-20 ENCOUNTER — Encounter (INDEPENDENT_AMBULATORY_CARE_PROVIDER_SITE_OTHER): Payer: Self-pay | Admitting: Family Medicine

## 2007-12-20 ENCOUNTER — Encounter (INDEPENDENT_AMBULATORY_CARE_PROVIDER_SITE_OTHER): Payer: Self-pay | Admitting: Gynecology

## 2007-12-25 ENCOUNTER — Telehealth: Payer: Self-pay | Admitting: *Deleted

## 2008-01-31 ENCOUNTER — Ambulatory Visit: Payer: Self-pay | Admitting: Family Medicine

## 2008-01-31 ENCOUNTER — Encounter (INDEPENDENT_AMBULATORY_CARE_PROVIDER_SITE_OTHER): Payer: Self-pay | Admitting: Family Medicine

## 2008-01-31 LAB — CONVERTED CEMR LAB
Chlamydia, DNA Probe: NEGATIVE
GC Probe Amp, Genital: NEGATIVE

## 2008-02-01 ENCOUNTER — Encounter (INDEPENDENT_AMBULATORY_CARE_PROVIDER_SITE_OTHER): Payer: Self-pay | Admitting: Family Medicine

## 2008-04-08 ENCOUNTER — Telehealth (INDEPENDENT_AMBULATORY_CARE_PROVIDER_SITE_OTHER): Payer: Self-pay | Admitting: *Deleted

## 2008-04-09 ENCOUNTER — Encounter (INDEPENDENT_AMBULATORY_CARE_PROVIDER_SITE_OTHER): Payer: Self-pay | Admitting: Family Medicine

## 2008-04-09 ENCOUNTER — Ambulatory Visit: Payer: Self-pay | Admitting: Family Medicine

## 2008-04-09 LAB — CONVERTED CEMR LAB: Whiff Test: NEGATIVE

## 2008-04-19 ENCOUNTER — Telehealth: Payer: Self-pay | Admitting: *Deleted

## 2008-06-05 ENCOUNTER — Encounter (INDEPENDENT_AMBULATORY_CARE_PROVIDER_SITE_OTHER): Payer: Self-pay | Admitting: Family Medicine

## 2008-06-05 ENCOUNTER — Other Ambulatory Visit: Admission: RE | Admit: 2008-06-05 | Discharge: 2008-06-05 | Payer: Self-pay | Admitting: Family Medicine

## 2008-06-05 ENCOUNTER — Ambulatory Visit: Payer: Self-pay | Admitting: Family Medicine

## 2008-06-05 LAB — CONVERTED CEMR LAB: GC Probe Amp, Genital: NEGATIVE

## 2008-06-06 LAB — CONVERTED CEMR LAB: Pap Smear: NORMAL

## 2008-06-12 ENCOUNTER — Encounter (INDEPENDENT_AMBULATORY_CARE_PROVIDER_SITE_OTHER): Payer: Self-pay | Admitting: Family Medicine

## 2008-06-27 ENCOUNTER — Telehealth: Payer: Self-pay | Admitting: *Deleted

## 2008-06-27 ENCOUNTER — Ambulatory Visit: Payer: Self-pay | Admitting: Family Medicine

## 2008-07-31 ENCOUNTER — Telehealth: Payer: Self-pay | Admitting: *Deleted

## 2008-08-02 ENCOUNTER — Encounter (INDEPENDENT_AMBULATORY_CARE_PROVIDER_SITE_OTHER): Payer: Self-pay | Admitting: Family Medicine

## 2008-08-02 ENCOUNTER — Ambulatory Visit: Payer: Self-pay | Admitting: Family Medicine

## 2008-08-02 LAB — CONVERTED CEMR LAB
GC Probe Amp, Genital: NEGATIVE
Glucose, Urine, Semiquant: NEGATIVE
Ketones, urine, test strip: NEGATIVE
Specific Gravity, Urine: 1.02
pH: 7

## 2009-03-11 ENCOUNTER — Ambulatory Visit: Payer: Self-pay | Admitting: Family Medicine

## 2009-04-08 ENCOUNTER — Encounter (INDEPENDENT_AMBULATORY_CARE_PROVIDER_SITE_OTHER): Payer: Self-pay | Admitting: Family Medicine

## 2009-04-08 ENCOUNTER — Ambulatory Visit: Payer: Self-pay | Admitting: Family Medicine

## 2009-04-08 ENCOUNTER — Telehealth: Payer: Self-pay | Admitting: *Deleted

## 2009-04-08 DIAGNOSIS — G510 Bell's palsy: Secondary | ICD-10-CM

## 2009-04-08 HISTORY — DX: Bell's palsy: G51.0

## 2009-04-08 LAB — CONVERTED CEMR LAB
Chlamydia, DNA Probe: NEGATIVE
GC Probe Amp, Genital: NEGATIVE
Whiff Test: POSITIVE

## 2009-04-22 ENCOUNTER — Ambulatory Visit: Payer: Self-pay | Admitting: Family Medicine

## 2009-06-09 ENCOUNTER — Other Ambulatory Visit: Admission: RE | Admit: 2009-06-09 | Discharge: 2009-06-09 | Payer: Self-pay | Admitting: Family Medicine

## 2009-06-09 ENCOUNTER — Encounter (INDEPENDENT_AMBULATORY_CARE_PROVIDER_SITE_OTHER): Payer: Self-pay | Admitting: Family Medicine

## 2009-06-09 ENCOUNTER — Ambulatory Visit: Payer: Self-pay | Admitting: Family Medicine

## 2009-06-09 DIAGNOSIS — S335XXA Sprain of ligaments of lumbar spine, initial encounter: Secondary | ICD-10-CM

## 2009-10-13 ENCOUNTER — Encounter: Payer: Self-pay | Admitting: Family Medicine

## 2009-10-13 ENCOUNTER — Ambulatory Visit: Payer: Self-pay | Admitting: Family Medicine

## 2009-10-13 DIAGNOSIS — N760 Acute vaginitis: Secondary | ICD-10-CM | POA: Insufficient documentation

## 2009-10-13 LAB — CONVERTED CEMR LAB

## 2009-10-14 ENCOUNTER — Encounter (INDEPENDENT_AMBULATORY_CARE_PROVIDER_SITE_OTHER): Payer: Self-pay | Admitting: *Deleted

## 2009-10-14 ENCOUNTER — Encounter: Payer: Self-pay | Admitting: Family Medicine

## 2009-10-14 DIAGNOSIS — F17200 Nicotine dependence, unspecified, uncomplicated: Secondary | ICD-10-CM | POA: Insufficient documentation

## 2009-10-17 ENCOUNTER — Telehealth: Payer: Self-pay | Admitting: Family Medicine

## 2009-12-01 ENCOUNTER — Encounter: Payer: Self-pay | Admitting: Family Medicine

## 2009-12-01 ENCOUNTER — Ambulatory Visit: Payer: Self-pay | Admitting: Family Medicine

## 2009-12-01 DIAGNOSIS — B373 Candidiasis of vulva and vagina: Secondary | ICD-10-CM

## 2009-12-01 DIAGNOSIS — N898 Other specified noninflammatory disorders of vagina: Secondary | ICD-10-CM | POA: Insufficient documentation

## 2009-12-01 LAB — CONVERTED CEMR LAB
Chlamydia, DNA Probe: NEGATIVE
GC Probe Amp, Genital: NEGATIVE
Whiff Test: NEGATIVE

## 2009-12-02 ENCOUNTER — Encounter: Payer: Self-pay | Admitting: Family Medicine

## 2010-02-27 ENCOUNTER — Ambulatory Visit: Payer: Self-pay | Admitting: Family Medicine

## 2010-02-27 ENCOUNTER — Encounter: Payer: Self-pay | Admitting: Family Medicine

## 2010-02-27 LAB — CONVERTED CEMR LAB: Chlamydia, DNA Probe: NEGATIVE

## 2010-03-04 ENCOUNTER — Encounter: Payer: Self-pay | Admitting: Family Medicine

## 2010-03-05 ENCOUNTER — Telehealth: Payer: Self-pay | Admitting: Family Medicine

## 2010-03-31 ENCOUNTER — Encounter: Payer: Self-pay | Admitting: Family Medicine

## 2010-03-31 ENCOUNTER — Ambulatory Visit: Payer: Self-pay | Admitting: Family Medicine

## 2010-03-31 LAB — CONVERTED CEMR LAB

## 2010-04-01 LAB — CONVERTED CEMR LAB: Chlamydia, DNA Probe: NEGATIVE

## 2010-06-09 ENCOUNTER — Ambulatory Visit: Payer: Self-pay | Admitting: Family Medicine

## 2010-07-10 ENCOUNTER — Encounter: Payer: Self-pay | Admitting: Family Medicine

## 2010-07-10 ENCOUNTER — Ambulatory Visit: Payer: Self-pay | Admitting: Family Medicine

## 2010-07-10 DIAGNOSIS — L84 Corns and callosities: Secondary | ICD-10-CM

## 2010-07-10 LAB — CONVERTED CEMR LAB
Pap Smear: NEGATIVE
Whiff Test: POSITIVE

## 2010-07-12 ENCOUNTER — Encounter: Payer: Self-pay | Admitting: Family Medicine

## 2010-07-24 ENCOUNTER — Ambulatory Visit: Payer: Self-pay | Admitting: Family Medicine

## 2010-10-21 ENCOUNTER — Encounter: Payer: Self-pay | Admitting: Family Medicine

## 2010-10-21 ENCOUNTER — Ambulatory Visit: Payer: Self-pay | Admitting: Family Medicine

## 2010-10-21 ENCOUNTER — Telehealth: Payer: Self-pay | Admitting: Family Medicine

## 2010-10-21 LAB — CONVERTED CEMR LAB

## 2010-10-22 ENCOUNTER — Telehealth: Payer: Self-pay | Admitting: *Deleted

## 2010-12-13 ENCOUNTER — Encounter: Payer: Self-pay | Admitting: Sports Medicine

## 2010-12-22 NOTE — Letter (Signed)
Summary: Results Letter  [facility]  [address]   [place], [place] [address]   Phone: [phone]  Fax: [phone]    12/02/2009  [facility] Files [address] [place], [place]  [address]  Dear Ms. Louro,  I am happy to inform you that the results of your recents tests, including gonorrhea and chlamydia, were both negative.  Sincerely,   Helane Rima DO  Appended Document: Results Letter mailed.

## 2010-12-22 NOTE — Letter (Signed)
Summary: Generic Letter  [facility]  [address]   [place], [place] [address]   Phone: [phone]  Fax: [phone]    07/12/2010  [facility] [address] [place], [place]  [address]  Dear Ms. Chay,    Your recent tests were negative.   Please call the clinic with any questions.       Sincerely,   Alvia Grove DO  Appended Document: Generic Letter mailed

## 2010-12-22 NOTE — Progress Notes (Signed)
Summary: re: labs/ts  ---- Converted from flag ---- ---- 10/22/2010 7:53 AM, Milinda Antis MD wrote: Please call pt today and let her know her GC/Chlamydia and wet prep resuts are negatve. She should do a trial using non latex condoms and urinate after sex. ------------------------------  called pt. no answer  .Marland KitchenArlyss Repress CMA,  October 22, 2010 10:03 AM tried to call pt again 'number is incorrect' .Arlyss Repress CMA,  October 22, 2010 11:40 AM

## 2010-12-22 NOTE — Progress Notes (Signed)
Summary: triage  Phone Note Call from Patient Call back at Home Phone [phone]   Caller: Patient Summary of Call: Pt has used medication for bacterial infection.  Now she is very irritated and feels like she needs something for a yeas inf. Initial call taken by: Clydell Hakim,  March 05, 2010 11:04 AM  Follow-up for Phone Call        uses [facility] . states she has thick white discharge from vagina. unable to get a ride here. asked that something be called in for this. told her md may ask that she come in. says she has limited ways to get here. told her i will call her with pcp response Follow-up by: Golden Circle RN,  March 05, 2010 11:17 AM    New/Updated Medications: DIFLUCAN 150 MG TABS (FLUCONAZOLE) once daily for 2 days Prescriptions: DIFLUCAN 150 MG TABS (FLUCONAZOLE) once daily for 2 days  #2 x 0   Entered by:   Luretha Murphy NP   Authorized by:   Alvia Grove DO   Signed by:   Luretha Murphy NP on 03/05/2010   Method used:   Electronically to        [facility] [address] [address] [phone]* (retail)       [address]       [place], [place]  [address]       Ph: [phone]       Fax: [phone]   RxID:   [id number]   I have never seen this pt.  Last OV 02-27-10 with Luretha Murphy.  Will forward to her, if she feels diflucan can be given having seen the pt last week then that's fine, if not pt needs appt or can try OTC.

## 2010-12-22 NOTE — Assessment & Plan Note (Signed)
Summary: yeast inf?,df   Vital Signs:  Patient profile:   30 year old female Weight:      127.7 pounds Temp:     98.5 degrees F Pulse rate:   60 / minute BP sitting:   111 / 78  (right arm) Cuff size:   regular  Vitals Entered By: Loralee Pacas CMA (December 01, 2009 10:25 AM) CC: yeast infection Comments pt states that she used a douche that was scented and she believes that it was made worse by that. she thought that it was a bacterial infection.   Primary Care Provider:  Alvia Grove DO  CC:  yeast infection.  History of Present Illness: 30 year old AAF:  1. ? Yeast Infection: + PMHx multiple Bacterial Vaginits Infx, felt that she had one last week so refilled Metronidazole x 1 week. Since she finished the Abx, she has had thick, white, cottage-cheese discharge. + Itch. LMP 11/06/09. + Sexually active. Wants to be checked for GC/Chlam. Recent HIV and RPR negative.  Current Medications (verified): 1)  Metrogel-Vaginal 0.75 % Gel (Metronidazole) .... One Applicator Full At Bedtime For 5 Nights, May Repeat For Symptoms 2)  Diflucan 150 Mg Tabs (Fluconazole) .... Once Daily  Allergies (verified): No Known Drug Allergies  Past History:  Past medical, surgical, family and social histories (including risk factors) reviewed for relevance to current acute and chronic problems.  Past Medical History: Reviewed history from 06/05/2008 and no changes required. bells palsy july - aug 2007 --> completed resolved [id number] (all vaginal but last baby which was c/s) h/o CMZ x2 (recently 07/2006)  Past Surgical History: Reviewed history from 06/05/2008 and no changes required. colposcopy 10/01--CIN 1 - 09/22/2000 IUD (strings not visible) - 11/23/2003, IUD placement confirmed by U/S - 11/22/2005, IUD removed - 08/09/2006 s/p BTL spring 2009  Family History: Reviewed history from 01/19/2007 and no changes required. DM on dad`s side., Extensive h/o HTN on both sides of family., PGF - stomach  CA., Two aunts - breast CA.  Social History: Reviewed history from 06/09/2009 and no changes required. 2 sons, Tenille Morrill and Elijio Miles. 2 dgt, [place] Bing and Geoffery Lyons (younger).  No EtoH/tob, previous MJ use. Man (father to some but not husband) also lived in house - he left 1 week after Harrold Donath was born. Practices high risk sexual behavior. Smokes occasionally.  Review of Systems General:  Denies chills and fever. GI:  Denies abdominal pain and change in bowel habits. GU:  Complains of discharge; denies abnormal vaginal bleeding, dysuria, genital sores, incontinence, urinary frequency, and urinary hesitancy. Derm:  Denies lesion(s) and rash.  Physical Exam  General:  Well-developed,well-nourished,in no acute distress; alert,appropriate and cooperative throughout examination. Vitals reviewed. Genitalia:  Pelvic Exam:        External: normal female genitalia without lesions or masses        Vagina: normal without lesions or masses, + thick white cottage cheese discharge. wet prep and GC/Chlam obtained.        Cervix: normal without lesions or masses Psych:  Normally interactive, good eye contact, and not anxious appearing.     Impression & Recommendations:  Problem # 1:  VAGINAL DISCHARGE (ICD-623.5) Assessment New  + Yeast. Awaiting GC/Chlam results.  Orders: GC/Chlamydia-[facility] (87591/87491) [facility]- Est Level  3 ([address])  Problem # 2:  CANDIDIASIS, VAGINAL (ICD-112.1) Assessment: New  Explained common occurence of yeast infection after antibiotic use. Rx Diflucan with one refill and instructions to repeat in one week if not resolved.  Her updated medication list for this problem includes:    Diflucan 150 Mg Tabs (Fluconazole) ..... Once daily  Orders: [facility]- Est Level  3 ([address])  Complete Medication List: 1)  Metrogel-vaginal 0.75 % Gel (Metronidazole) .... One applicator full at bedtime for 5 nights, may repeat for symptoms 2)  Diflucan 150 Mg Tabs (Fluconazole) ....  Once daily  [facility] [id number][facility]  Patient Instructions: 1)  It was nice to meet you today! 2)  I have sent your prescription to the pharmacy. 3)  We will call with the results of your tests. Prescriptions: DIFLUCAN 150 MG TABS (FLUCONAZOLE) once daily  #1 x 1   Entered and Authorized by:   Helane Rima DO   Signed by:   Helane Rima DO on 12/01/2009   Method used:   Electronically to        [facility] [phone]* (retail)       [address]       [place], [place]  [address]       Ph: [phone]       Fax: [phone]   RxID:   [id number]   Laboratory Results  Date/Time Received: December 01, 2009 10:43 AM  Date/Time Reported: December 01, 2009 10:48 AM   [place] Source: vaginal WBC/hpf: 1-5 Bacteria/hpf: 2+  Rods Clue cells/hpf: none  Negative whiff Yeast/hpf: moderate Trichomonas/hpf: none Comments: ...........test performed by...........Marland KitchenTerese Door, CMA

## 2010-12-22 NOTE — Progress Notes (Signed)
Summary: Lab results  Phone Note Outgoing Call Call back at [phone]   Call placed by: Milinda Antis MD,  October 21, 2010 12:01 PM Details for Reason: LVM Summary of Call: Lab results  Follow-up for Phone Call        Returned patient call, given neg wet prep results on answering machine, will call back tomorrow for gonorrhea/chlamydia Follow-up by: Milinda Antis MD,  October 21, 2010 2:50 PM

## 2010-12-22 NOTE — Assessment & Plan Note (Signed)
Summary: yeast infection per pt/eo   Vital Signs:  Patient profile:   30 year old female Height:      63.5 inches Weight:      131.4 pounds BMI:     22.99 Temp:     98.6 degrees F oral Pulse rate:   64 / minute BP sitting:   120 / 74  (left arm) Cuff size:   regular  Vitals Entered By: Garen Grams LPN (June 09, 2010 10:05 AM) CC: yeast inf per pt Is Patient Diabetic? No Pain Assessment Patient in pain? no        Primary Care Provider:  Alvia Grove DO  CC:  yeast inf per pt.  History of Present Illness: 1) Yeast infxn: Recently treated bacterial vaginosis w/ metronidazole gel in May 2011. Developed thick white discharge with mild vaginal itching subsequent to treatment. Denies dysuria, hematura, abdominal pain, nausea, vomiting, fever.   Med rec: No current meds   Habits & Providers  Alcohol-Tobacco-Diet     Tobacco Status: never  Allergies (verified): No Known Drug Allergies  Review of Systems       as per HPI o/w negative   Physical Exam  General:  vital signs reviewed and normal Alert, appropriate; well-dressed and well-nourished  Genitalia:  normal introitus, no external lesions, mucosa pink and moist, no vaginal or cervical lesions, and no vaginal atrophy.    thick white vaginal discharge    Impression & Recommendations:  Problem # 1:  VAGINAL DISCHARGE (ICD-623.5) Assessment New Vaginal candidiasis. Will treat with diflucan as below. Follow up with PCP as needed.   Orders: Wet Prep- [facility] ([address]) [facility]- Est Level  3 ([address])  Complete Medication List: 1)  Diflucan 150 Mg Tabs (Fluconazole) .... Once daily for 2 days  Patient Instructions: 1)  It was great to see you today!  2)  We have sent a prescription for yeast infection to your pharmacy. Take the medication as instructed.  3)  If you could be exposed to sexually transmitted diseases. you should use a condom.  Prescriptions: DIFLUCAN 150 MG TABS (FLUCONAZOLE) once daily for 2 days  #2 x  0   Entered and Authorized by:   Bobby Rumpf  MD   Signed by:   Bobby Rumpf  MD on 06/09/2010   Method used:   Electronically to        [facility] [phone]* (retail)       [address]       [place], [place]  [address]       Ph: [phone]       Fax: [phone]   RxID:   [id number]   Laboratory Results  Date/Time Received: June 09, 2010 10:20 AM  Date/Time Reported: June 09, 2010 10:49 AM   [place] Source: vag WBC/hpf: >20 Bacteria/hpf: 3+  Rods Clue cells/hpf: none  Negative whiff Yeast/hpf: many Trichomonas/hpf: none Comments: ...............test performed by......Marland KitchenBonnie A. [place], MLS (ASCP)cm

## 2010-12-22 NOTE — Assessment & Plan Note (Signed)
Summary: cpe/pap,df   Vital Signs:  Patient profile:   30 year old Strickland Height:      63.5 inches Weight:      129 pounds BMI:     22.57 BSA:     1.62 Temp:     98.9 degrees F Pulse rate:   63 / minute BP sitting:   108 / 74  Vitals Entered By: Jone Baseman CMA (July 10, 2010 1:43 PM) CC: CPP Is Patient Diabetic? No Pain Assessment Patient in pain? no        Primary Care Noreene Boreman:  Alvia Grove DO  CC:  CPP.  History of Present Illness: 30 yo Strickland here for yearly pap exam.  Also concerned about STD exposure. Recently had unprotected intercorse with her ex-husband.  Per pt, ex-husband, has a new girlfriend so pt is now concerned for exposure to STD's.  No complaints of discharge, pain, abd pain, or odor.  No concern for pregnancy as pt has had tubes tied.     Habits & Providers  Alcohol-Tobacco-Diet     Tobacco Status: never  Exercise-Depression-Behavior     STD Risk: current     Drug Use: marijuanna  Current Problems (verified): 1)  Callus, Left Foot  (ICD-700) 2)  Candidiasis, Vaginal  (ICD-112.1) 3)  Vaginal Discharge  (ICD-623.5) 4)  Tobacco User  (ICD-305.1) 5)  Vaginitis, Bacterial  (ICD-616.10) 6)  Routine Gynecological Examination  (ICD-V72.31) 7)  Lumbar Strain  (ICD-847.2) 8)  Screening For Malignant Neoplasm of The Cervix  (ICD-V76.2) 9)  Contact or Exposure To Other Viral Diseases  (ICD-V01.79) 10)  Bell's Palsy, Hx of  (ICD-V12.49)  Current Medications (verified): 1)  Diflucan 150 Mg Tabs (Fluconazole) .... Once Daily For 2 Days  Allergies (verified): No Known Drug Allergies  Past History:  Past Medical History: Last updated: 06/05/2008 bells palsy july - aug 2007 --> completed resolved [id number] (all vaginal but last baby which was c/s) h/o CMZ x2 (recently 07/2006)  Past Surgical History: Last updated: 03/31/2010 colposcopy 10/01--CIN 1 - 09/22/2000 IUD (strings not visible) - 11/23/2003, IUD placement confirmed by U/S -  11/22/2005, IUD removed - 08/09/2006 s/p BTL Jan 2009  Family History: Last updated: 01/19/2007 DM on dad`s side., Extensive h/o HTN on both sides of family., PGF - stomach CA., Two aunts - breast CA.  Social History: Last updated: 06/09/2009 2 sons, Dorreen Valiente and Elijio Miles. 2 dgt, [place] Bing and Geoffery Lyons (younger).  No EtoH/tob, previous MJ use. Man (father to some but not husband) also lived in house - he left 1 week after Harrold Donath was born. Practices high risk sexual behavior. Smokes occasionally.  Risk Factors: Caffeine Use: 1 (04/09/2008)  Risk Factors: Smoking Status: never (07/10/2010) Packs/Day: 3/20 (04/09/2008)  Social History: STD Risk:  current Drug Use:  marijuanna  Review of Systems  The patient denies abdominal pain and abnormal bleeding.    Physical Exam  General:  Vs reviewed, alert, well-developed, well-nourished, and well-hydrated.   Lungs:  normal respiratory effort, no intercostal retractions, no accessory muscle use, normal breath sounds, no crackles, and no wheezes.   Heart:  regular rate and rhythm, no murmurs; normal s1/s2  Abdomen:  soft, non-tender, and normal bowel sounds.    Pelvic Exam  Vulva:      normal appearance, normal hair distribution, no lesions or masses.   Vagina:      normal, ruggated, physiologic discharge, no lesions, no masses, no cystocele, adequate pelvic support.   Cervix:  normal, midposition, no CMT, no lesions.   Uterus:      smooth, anteverted, anteflexed, mobile, non-tender, firm, adequate support, no prolapse.      Impression & Recommendations:  Problem # 1:  SCREENING FOR MALIGNANT NEOPLASM OF THE CERVIX (ICD-V76.2) Assessment New yearly pap completed today Orders: Pap Smear-[facility] ([id number]) [facility]- Est  Level 4 ([address])  Problem # 2:  CONTACT OR EXPOSURE TO OTHER VIRAL DISEASES (ICD-V01.79) Assessment: New discussed importance of safe sex. GC/chylamdia completed today. Pt is  asymptomatic. Orders: [facility]- Est  Level 4 ([address])  Complete Medication List: 1)  Diflucan 150 Mg Tabs (Fluconazole) .... Once daily for 2 days  [facility] [phone]) GC/Chlamydia-[facility] (87591/87491)  Laboratory Results  Date/Time Received: July 10, 2010 2:05 PM  Date/Time Reported: July 10, 2010 2:11 PM   [employer] Source: vaginal WBC/hpf: 1-5 Bacteria/hpf: 3+  Cocci Clue cells/hpf: moderate  Positive whiff Yeast/hpf: none Trichomonas/hpf: none Comments: ...........test performed by...........Marland KitchenTerese Door, CMA

## 2010-12-22 NOTE — Assessment & Plan Note (Signed)
Summary: facial weakness/[facility]   Vital Signs:  Patient profile:   30 year old female Height:      63.5 inches Weight:      126 pounds BMI:     22.05 BSA:     1.60 Temp:     98.3 degrees F Pulse rate:   64 / minute BP sitting:   109 / 77  Vitals Entered By: Jone Baseman CMA (July 24, 2010 10:34 AM) CC: numbness and tingling right side of face x 2 days Is Patient Diabetic? No Pain Assessment Patient in pain? no        Primary Care Angelette Ganus:  Alvia Grove DO  CC:  numbness and tingling right side of face x 2 days.  History of Present Illness: 1) Right sided facial weakness: Felt mild tingling last night before going to bed. Woke up with right sided facial weakness. Reports that she feels that her face is "drawn over to the right", she has been unable to blink out of her right eye. She did note that she started crying when she realized this was happening and that she had good tear production from both sides. Reports history of Bell's Palsy about 4 years ago which lasted for about 2 weeks.  ROS: Denies fever, chills, cold sore, history of herpes, sialorrhea, dry mouth, URI symptoms, hearing difficulty, vision change, nausea, emesis, vertigo, swallowing difficulty, slurring speech, weakness or numbness elsewhere, difficulty thinking, lethargy.  Med rec: none currently.   Habits & Providers  Alcohol-Tobacco-Diet     Tobacco Status: quit     Cigarette Packs/Day: 3/20  Allergies (verified): No Known Drug Allergies  Social History: Smoking Status:  quit  Physical Exam  General:  Vs reviewed, alert, well-developed, well-nourished, and well-hydrated.   Head:  Normocephalic and atraumatic unable to fully raise right eyebrow  mild  Eyes:  pupils equal, round and reactive to light , extraoccular movements intact , vision grossly intact, good tear production, right eyelid weakness with opening and closing; normal fundi w/o signs of increased ICP  Ears:  TMs clear  bilaterally, hearing intact bilaterally, no abnormalities noted on otoscopic exam  Nose:  no congestion or rhinorrhea, flattened nasolabial fold right side  Mouth:   right sided facial droop; normal swallowing  Neck:  no masses or lymphadenopathy   Lungs:  normal respiratory effort, no intercostal retractions, no accessory muscle use, normal breath sounds, no crackles, and no wheezes.   Msk:  5/5 strength bikateral upper and lower extremities  Pulses:  2+ radials  Neurologic:  cranial nerve 7 defects as noted above, otherwise CN II-XII intact, sensation intact throughout face, upper and lower extremities.  Skin:  no rashes or lesions    Impression & Recommendations:  Problem # 1:  BELLS PALSY (ICD-351.0) Assessment New  Likely given presentaion, prior history. Lack of forehead sparing consistent with peripheral as oppsed to central process. No prior history of herpes, no herpetic lesions. Will treat with prednisone course as below. Reviwed red flags that would prompt return to care. Follow up as needed. with PCP.   Orders: [facility]- Est Level  3 ([address])  Complete Medication List: 1)  Diflucan 150 Mg Tabs (Fluconazole) .... Once daily for 2 days 2)  Prednisone 50 Mg Tabs (Prednisone) .... One tab by mouth qday x 10 days 3)  Visine Tears 0.2-0.36-1 % Soln (Glycerin-hypromellose-peg 400) .... Apply to right eye every one to  two hours. disp one bottle  Patient Instructions: 1)  Wear eye patch  over your right eye at night. 2)  Use over the counter eye drops like Visine moisturizing drops every two hours in your right eye.  3)  Take prednisone as directed.  4)  If you have worsening symptoms come back in.  5)  Wear a patch over your affected eye while sleeping until symptoms improve.  Prescriptions: VISINE TEARS 0.2-0.36-1 % SOLN (GLYCERIN-HYPROMELLOSE-PEG 400) apply to right eye every one to  two hours. Disp one bottle  #1 x 6   Entered and Authorized by:   Bobby Rumpf  MD   Signed by:    Bobby Rumpf  MD on 07/24/2010   Method used:   Electronically to        [facility] [address] (retail)       [address]       [place], [place]  [id number]       Ph: [phone]       Fax: [phone]   RxID:   [id number] PREDNISONE 50 MG TABS (PREDNISONE) one tab by mouth qday x 10 days  #10 x 0   Entered and Authorized by:   Bobby Rumpf  MD   Signed by:   Bobby Rumpf  MD on 07/24/2010   Method used:   Electronically to        [facility] [address] (retail)       [address]       [place], [place]  [address]       Ph: [phone]       Fax: [phone]   RxID:   [id number]

## 2010-12-22 NOTE — Assessment & Plan Note (Signed)
Summary: pain in side/discharge,tcb   Vital Signs:  Patient profile:   30 year old female Height:      63.5 inches Weight:      130 pounds BMI:     22.75 BSA:     1.62 Temp:     98.6 degrees F Pulse rate:   70 / minute BP sitting:   123 / 90  Vitals Entered By: Jone Baseman CMA (Mar 31, 2010 10:55 AM) CC: right side pain and vaginal d/c Is Patient Diabetic? No Pain Assessment Patient in pain? no        Primary Care Provider:  Alvia Grove DO  CC:  right side pain and vaginal d/c.  History of Present Illness: 1. R side pain and vaginal discharge  Had unprotected intercourse on Friday with ex-BF. Developed R side pain Saturday and Sunday. Pain has resolved at this point. Still having vaginal discharge and would like to be checked for STDs.  fevers:  no   chills: no    nausea: no    vomiting: no    diarrhea: no     dysuria: no   periods normal: getting back to normal  PMHx:  has had multiple episodes of BV...most recent in April.    Had c/s and BTL Jan 2009.   Habits & Providers  Alcohol-Tobacco-Diet     Tobacco Status: never  Current Medications (verified): 1)  Diflucan 150 Mg Tabs (Fluconazole) .... Once Daily For 2 Days 2)  Flagyl 500 Mg Tabs (Metronidazole) .... Take 4 Tabs By Mouth X 1  Allergies: No Known Drug Allergies  Past History:  Past Surgical History: colposcopy 10/01--CIN 1 - 09/22/2000 IUD (strings not visible) - 11/23/2003, IUD placement confirmed by U/S - 11/22/2005, IUD removed - 08/09/2006 s/p BTL Jan 2009  Social History: Smoking Status:  never  Review of Systems       review of systems as noted in HPI section   Physical Exam  General:  vital signs reviewed and normal Alert, appropriate; well-dressed and well-nourished  Lungs:  work of breathing unlabored, clear to auscultation bilaterally; no wheezes, rales, or ronchi; good air movement throughout  Heart:  regular rate and rhythm, no murmurs; normal s1/s2  Abdomen:  +BS, soft,  mild suprapubic tenderness to deep palpation but otherwise non-tenderr, non-distended; no masses; no rebound or guarding; negative murphy's Genitalia:  Normal introitus for age, no external lesions, no vaginal discharge, mucosa pink and moist, no vaginal or cervical lesions, no vaginal atrophy, no friaility or hemorrhage, normal uterus size and position, no adnexal masses or tenderness. GC/chlamydia/wet prep obtained.    Impression & Recommendations:  Problem # 1:  VAGINAL DISCHARGE (ICD-623.5) Assessment Deteriorated  few clue cells on wet prep. Has had multiple episodes of BV. Would be a candidate for suppressive tx but will defer this to PCP. follow-up on GC/chlamydia and HIV/RPR.   discussed safe sex. Pt expresses agreement and understanding  The following medications were removed from the medication list:    Metrogel-vaginal 0.75 % Gel (Metronidazole) ..... One applicator full at bedtime as directed, may repeat for symptoms  Orders: [facility]- Est Level  3 ([address])  Complete Medication List: 1)  Diflucan 150 Mg Tabs (Fluconazole) .... Once daily for 2 days 2)  Flagyl 500 Mg Tabs (Metronidazole) .... Take 4 tabs by mouth x 1  Other Orders: Wet Prep- [facility] (60454) GC/Chlamydia-[facility] (87591/87491) RPR-[facility] [id number]) HIV-[facility] ([id number]) Prescriptions: FLAGYL 500 MG TABS (METRONIDAZOLE) take 4 tabs by mouth x 1  #4  x 0   Entered and Authorized by:   Myrtie Soman  MD   Signed by:   Myrtie Soman  MD on 03/31/2010   Method used:   Electronically to        [facility] [address] [phone]* (retail)       [address]       [place], [place]  [address]       Ph: [phone]       Fax: [phone]   RxID:   [id number]    Prevention & Chronic Care Immunizations   Influenza vaccine: Fluvax 3+  (09/07/2007)   Influenza vaccine due: 09/06/2008    Tetanus booster: 06/09/2009: Tdap   Tetanus booster due: 06/22/2009    Pneumococcal vaccine: Not documented  Other Screening   Pap  smear: NEGATIVE FOR INTRAEPITHELIAL LESIONS OR MALIGNANCY.  (06/09/2009)   Pap smear due: 06/06/2009   Smoking status: never  (03/31/2010)  Laboratory Results  Date/Time Received: Mar 31, 2010 11:19 AM  Date/Time Reported: Mar 31, 2010 11:27 AM   [place] Source: vaginal WBC/hpf: 0-3 Bacteria/hpf: 3+  Cocci Clue cells/hpf: few  Positive whiff Yeast/hpf: none Trichomonas/hpf: none Comments: rod bacteria also present...........test performed by...........Marland KitchenTerese Door, CMA

## 2010-12-22 NOTE — Assessment & Plan Note (Signed)
Summary: bacterial infection?/allergy to latex/eo   Vital Signs:  Patient profile:   30 year old female Height:      63.5 inches Weight:      127.8 pounds BMI:     22.36 Temp:     98.4 degrees F oral Pulse rate:   62 / minute BP sitting:   134 / 80  (left arm) Cuff size:   regular  Vitals Entered By: Jimmy Footman, CMA (October 21, 2010 10:49 AM) CC: STD concerns Is Patient Diabetic? No Pain Assessment Patient in pain? no        Primary Care Provider:  Alvia Grove DO  CC:  STD concerns.  History of Present Illness:  [phone]  Wants to be checked for STD, a few weeks ago had intercourse with partner and the condom broke, has BTL Discharge a few days ago, thin white discharge  - no redness in vaginal area, minor itch , foul odor, feels irratated after sex, unsure if she is allergic to latex  No abd pain, no fever, no n/v, no dysuria  1 sexual partner LMP-Nov 27,2011   Habits & Providers  Alcohol-Tobacco-Diet     Tobacco Status: quit  Current Medications (verified): 1)  Visine Tears 0.2-0.36-1 % Soln (Glycerin-Hypromellose-Peg 400) .... Apply To Right Eye Every One To  Two Hours. Disp One Bottle  Allergies (verified): No Known Drug Allergies  Past History:  Past Surgical History: Last updated: 03/31/2010 colposcopy 10/01--CIN 1 - 09/22/2000 IUD (strings not visible) - 11/23/2003, IUD placement confirmed by U/S - 11/22/2005, IUD removed - 08/09/2006 s/p BTL Jan 2009   Physical Exam  General:  Vs reviewed, alert, well-developed, well-nourished, and well-hydrated.   Vital signs noted  Abdomen:  soft and non-tender.   Genitalia:  normal introitus and no external lesions.  Brown discharge from cervix, thin white discharge in vault, no bleeding noted, uterus normal size and position, no lesions noted internally or externally, no CMT mucousa moist   Impression & Recommendations:  Problem # 1:  VAGINITIS (ICD-616.10) Assessment New Check Wet Prep, GC/Chlamydia,  may have some latex irritation discuss urinating after sex, possibly changing condoms. Wet prep negative for acute infection.  Orders: Wet Prep- [facility] [id number]) GC/Chlamydia-[facility] (87591/87491) [facility]- Est Level  3 (78295)  Complete Medication List: 1)  Visine Tears 0.2-0.36-1 % Soln (Glycerin-hypromellose-peg 400) .... Apply to right eye every one to  two hours. disp one bottle   Orders Added: 1)  Wet Prep- [facility] [87210] 2)  GC/Chlamydia-[facility] [87591/87491] 3)  [facility]- Est Level  3 [62130]    Laboratory Results  Date/Time Received: October 21, 2010 11:15 AM  Date/Time Reported: October 21, 2010 11:36 AM   [employer] Source: vaginal WBC/hpf: 1-5 Bacteria/hpf: 3+  Rods Clue cells/hpf: none Yeast/hpf: none Trichomonas/hpf: none Comments: ...........test performed by...........Marland KitchenTerese Door, CMA

## 2010-12-22 NOTE — Miscellaneous (Signed)
  Clinical Lists Changes  Orders: Added new Test order of [facility]- Est Level  3 (99213) - Signed 

## 2010-12-22 NOTE — Assessment & Plan Note (Signed)
Summary: ? bacterial inf,tcb   Vital Signs:  Patient profile:   30 year old female Weight:      131.4 pounds Pulse rate:   61 / minute BP sitting:   110 / 71  (right arm)  Vitals Entered By: Arlyss Repress CMA, (February 27, 2010 10:45 AM) CC: vag d/c x 1 week. request STD check. Is Patient Diabetic? No Pain Assessment Patient in pain? no        Primary Care Provider:  Alvia Grove DO  CC:  vag d/c x 1 week. request STD check..  History of Present Illness: Here for bacterial infection, "cannot stand" having them.  Recurrent, she understands that thses are recurring.  No new sexual partners in the past year.  Vaginal and oral intercourse.  Uses condoms.  She thinks she has them because of her IUD.  Habits & Providers  Alcohol-Tobacco-Diet     Tobacco Status: quit  Allergies: No Known Drug Allergies  Social History: Smoking Status:  quit   Impression & Recommendations:  Problem # 1:  VAGINITIS, BACTERIAL (ICD-616.10) Recurring, explained chronic nature of this, recommended self treatment with metro gel as needed, STD checks every 6 months because of high risk activity. Her updated medication list for this problem includes:    Metrogel-vaginal 0.75 % Gel (Metronidazole) ..... One applicator full at bedtime as directed, may repeat for symptoms  Complete Medication List: 1)  Metrogel-vaginal 0.75 % Gel (Metronidazole) .... One applicator full at bedtime as directed, may repeat for symptoms 2)  Diflucan 150 Mg Tabs (Fluconazole) .... Once daily  [id number]) GC/Chlamydia-[facility] (87591/87491)  Patient Instructions: 1)  Bacterial vaginosis is a recurring infection that will not cause you any harm, use the gel for 2-3 nights when you have the symptoms to mangage this. 2)  If you could be exposed to sexually transmitted diseases. you should use a condom.  Prescriptions: METROGEL-VAGINAL 0.75 % GEL (METRONIDAZOLE) one applicator full at bedtime as  directed, may repeat for symptoms  #1 x 6   Entered and Authorized by:   Luretha Murphy NP   Signed by:   Luretha Murphy NP on 02/27/2010   Method used:   Electronically to        [facility] [address] [phone]* (retail)       [address]       [place], [place]  [address]       Ph: [phone]       Fax: [phone]   RxID:   [id number]   Laboratory Results  Date/Time Received: February 27, 2010 11:16 AM  Date/Time Reported: February 27, 2010 11:24 AM   [place] Source: vag WBC/hpf: 10-20 Bacteria/hpf: 3+  Cocci Clue cells/hpf: many  Positive whiff Yeast/hpf: none Trichomonas/hpf: none Comments: ...............test performed by......Marland KitchenBonnie A. [place], MLS (ASCP)cm

## 2011-01-03 ENCOUNTER — Encounter: Payer: Self-pay | Admitting: *Deleted

## 2011-02-16 ENCOUNTER — Ambulatory Visit (INDEPENDENT_AMBULATORY_CARE_PROVIDER_SITE_OTHER): Payer: Medicaid Other | Admitting: Family Medicine

## 2011-02-16 ENCOUNTER — Encounter: Payer: Self-pay | Admitting: Family Medicine

## 2011-02-16 VITALS — BP 116/71 | HR 62 | Temp 98.4°F | Ht 62.5 in | Wt 126.0 lb

## 2011-02-16 DIAGNOSIS — G510 Bell's palsy: Secondary | ICD-10-CM

## 2011-02-16 LAB — HIV ANTIBODY (ROUTINE TESTING W REFLEX): HIV: NONREACTIVE

## 2011-02-16 MED ORDER — PREDNISONE 20 MG PO TABS: 60.0000 mg | ORAL_TABLET | Freq: Every day | ORAL | Status: AC

## 2011-02-16 NOTE — Progress Notes (Signed)
  Subjective:    Patient ID: Sierra Strickland, female    DOB: Jun 05, 1981, 30 y.o.   MRN: [id number]  HPI  1. Left sided facial weakness: Felt mild tingling last night before going to bed. Woke up with left sided facial weakness. Reports that she feels that her face is "drawn over to the left", she has been unable to blink out of her left eye. She did note that she started crying when she realized this was happening and that she had good tear production from both sides. Reports history of Bell's Palsy, each lasting for about 2 weeks. States that she has been told that it is caused by stress. She wants to be tested for HIV today after reading more about causes of Bell's Palsy.  ROS: Denies fever, chills, cold sore, history of herpes, sialorrhea, dry mouth, URI symptoms, hearing difficulty, vision change, nausea, emesis, vertigo, swallowing difficulty, slurring speech, weakness or numbness elsewhere, difficulty thinking, lethargy.  Review of Systems SEE HPI.    Objective:   Physical Exam General:  VS reviewed, alert, well-developed, well-nourished, and well-hydrated.   Head:  Normocephalic and atraumatic. Unable to fully raise left eyebrow.  Eyes:  Pupils equal, round and reactive to light , extraoccular movements intact , vision grossly intact, good tear production, right eyelid weakness with opening and closing; normal fundi w/o signs of increased ICP.  Ears:  TMs clear bilaterally, hearing intact bilaterally, no abnormalities noted on otoscopic exam.  Nose:  No congestion or rhinorrhea, flattened nasolabial fold left side.  Mouth:   Left sided facial droop; normal swallowing.  Neck:  no masses or lymphadenopathy   Lungs:  Normal respiratory effort, no intercostal retractions, no accessory muscle use, normal breath sounds, no crackles, and no wheezes.   Msk:  5/5 strength bikateral upper and lower extremities.  Pulses:  2+ radials.  Neurologic:  Cranial nerve 7 defects as noted above, otherwise  CN II-XII intact, sensation intact throughout face, upper and lower extremities.     Assessment & Plan:

## 2011-02-16 NOTE — Patient Instructions (Signed)
Bell's Palsy Bell's palsy is a condition in which the muscles on one side of the face cannot move (paralysis). This is because the nerves in the face are paralyzed. It is most often thought to be caused by a virus. The virus causes swelling of the nerve that controls movement on one side of the face. The nerve travels through a tight space surrounded by bone. When the nerve swells, it can be compressed by the bone. This results in damage to the protective covering around the nerve. This damage interferes with how the nerve communicates with the muscles of the face. As a result, it can cause weakness or paralysis of the facial muscles.  Injury (trauma), tumor, and surgery may cause Bell's palsy, but most of the time the cause is unknown. It is a relatively common condition. It starts suddenly (abrupt onset) with the paralysis usually ending within 2 days. Bell's palsy is not dangerous. But because the eye does not close properly, you may need care to keep the eye from getting dry. This can include splinting (to keep the eye shut) or moistening with artificial tears. Bell's palsy very seldom occurs on both sides of the face at the same time. SYMPTOMS  Eyebrow sagging.   Drooping of the eye and corner of the mouth.   Inability to close one eye.   Loss of taste on the front of the tongue.   Sensitivity to loud noises.  TREATMENT The treatment is usually non-surgical. If the patient is seen within the first 24 to 48 hours, a short course of steroids may be prescribed, in an attempt to shorten the length of the condition. Antiviral medicines may also be used with the steroids, but it is unclear if they are helpful.  You will need to protect your eye, if you cannot close it. The cornea (clear covering over your eye) will become dry and can be damaged. Artificial tears can be used to keep your eye moist. Glasses or an eye patch should be worn to protect your eye. PROGNOSIS Recovery is variable, ranging  from days to months. Although the problem usually goes away completely (about 80% of cases resolve), predicting the outcome is impossible. Most people improve within 3 weeks of when the symptoms began. Improvement may continue for 3-6 months. A small number of people have moderate to severe weakness that is permanent.  HOME CARE INSTRUCTIONS  If your caregiver prescribed medicine to reduce swelling in the nerve, use as directed. Do not stop taking the medicine unless directed by your caregiver.   Use moisturizing eye drops as needed to prevent drying of your eye, as directed by your caregiver.   Protect your eye, as directed by your caregiver.   Use facial massage and exercises, as directed by your caregiver.   Perform your normal activities, and get your normal rest.  SEEK IMMEDIATE MEDICAL CARE IF:  There is pain, redness or irritation in the eye.   You or your child has an oral temperature above 100.4, not controlled by medicine.  MAKE SURE YOU:   Understand these instructions.   Will watch your condition.   Will get help right away if you are not doing well or get worse.  Document Released: 11/08/2005 Document Re-Released: 02/02/2010 [facility] Patient Information 2011 [place], [place].

## 2011-02-16 NOTE — Assessment & Plan Note (Signed)
Prednisone burst. Will check HIV today. Discussed Dx with patient at length as she had many concerns. Advised patient to follow up with PCP in one week if not improving or worsening.

## 2011-02-17 ENCOUNTER — Encounter: Payer: Self-pay | Admitting: Family Medicine

## 2011-03-03 ENCOUNTER — Telehealth: Payer: Self-pay | Admitting: Family Medicine

## 2011-03-03 NOTE — Telephone Encounter (Signed)
Would like to know results of last labs.

## 2011-03-04 NOTE — Telephone Encounter (Signed)
Test normal

## 2011-03-05 NOTE — Telephone Encounter (Signed)
I don't feel that I should be the one to call patient to let her know about results, even tho they are normal.  Please call to let her know. Thanks [employer]

## 2011-03-05 NOTE — Telephone Encounter (Signed)
Patient informed of normal test results.

## 2011-03-30 ENCOUNTER — Ambulatory Visit (INDEPENDENT_AMBULATORY_CARE_PROVIDER_SITE_OTHER): Payer: Medicaid Other | Admitting: Family Medicine

## 2011-03-30 ENCOUNTER — Encounter: Payer: Self-pay | Admitting: Family Medicine

## 2011-03-30 VITALS — BP 107/72 | HR 69 | Temp 98.8°F | Wt 125.8 lb

## 2011-03-30 DIAGNOSIS — N898 Other specified noninflammatory disorders of vagina: Secondary | ICD-10-CM | POA: Insufficient documentation

## 2011-03-30 DIAGNOSIS — N946 Dysmenorrhea, unspecified: Secondary | ICD-10-CM | POA: Insufficient documentation

## 2011-03-30 MED ORDER — PROGESTERONE MICRONIZED 100 MG PO CAPS: ORAL_CAPSULE | ORAL | Status: DC

## 2011-03-30 NOTE — Patient Instructions (Signed)
The vaginal discharge is normal.  It is only abnormal if you continue to bleed of have vaginal discharge Try progesterone for the heavy cramps and bleeding.  Use 1 day before and for the first 2-3 days of your cycle Please schedule a follow up appointment in 2-3 months

## 2011-03-30 NOTE — Assessment & Plan Note (Signed)
Likely from relative hyperestrogenism and low progesterone from her BTL.  Will start Prometrium for the first couple days of her cycle.

## 2011-03-30 NOTE — Progress Notes (Signed)
  Subjective:    Patient ID: Sierra Strickland, female    DOB: [birth year], 30 y.o.   MRN: [id number]  HPI 1. Vaginal discharge:  Pt had a normal period last week.  However, two days after her period she had some dark blood vaginal discharge.  There was only one episode of that and since then she has had no vaginal discharge of bleeding.  2. Dysmennorhea:  She has terrible cramps and bleeding for the first 2 days of her cycle.  The cramping hurts her so bad that she can't even walk around sometimes.  She endorses heavy vaginal bleeding during her cycles.  Also endorses some breast tenderness  PSHx: Hx of BTL   Review of Systems Denies fevers, vaginal itching, vaginal pain, abdominal pain, rash, chills, heart racing, weakness, fatigue    Objective:   Physical Exam  Vitals reviewed. Constitutional: She appears well-nourished. No distress.  HENT:  Mouth/Throat: Oropharynx is clear and moist.  Eyes: Conjunctivae are normal. No scleral icterus.  Neck: Normal range of motion. Neck supple. No thyromegaly present.  Cardiovascular: Normal rate, regular rhythm and normal heart sounds.   Pulmonary/Chest: Effort normal and breath sounds normal. No respiratory distress. She has no wheezes.  Abdominal: Soft. Bowel sounds are normal. She exhibits no distension and no mass. There is no tenderness. There is no rebound and no guarding.  Skin: Skin is warm and dry. No rash noted.  Psychiatric: She has a normal mood and affect.          Assessment & Plan:

## 2011-03-30 NOTE — Assessment & Plan Note (Signed)
One episode of what sounds like dark blood vaginal discharge.  No vaginal discharge or bleeding since then.  No work up needed at this time.  Advised patient to continue to monitor.  Precuations given.

## 2011-04-06 NOTE — Op Note (Signed)
Sierra Strickland, AREOLA                ACCOUNT NO.:  [id number]   MEDICAL RECORD NO.:  [id number]          PATIENT TYPE:  INP   LOCATION:  9111                          FACILITY:  [facility]   PHYSICIAN:  Ginger Carne, MD  DATE OF BIRTH:  [birth year]   DATE OF PROCEDURE:  12/20/2007  DATE OF DISCHARGE:                               OPERATIVE REPORT   PREOPERATIVE DIAGNOSIS:  Failure to progress, first stage of labor,  nonreassuring fetal heart rate.  Sterilization.   POSTOPERATIVE DIAGNOSIS:  Failure to progress, first stage of labor,  nonreassuring fetal heart rate.  Term viable delivery of female infant.  Sterilization.   OPERATION PERFORMED:  Primary low transverse cesarean section.  Pomeroy  bilateral tubal ligation.   SURGEON:  Ginger Carne, MD   ASSISTANT:  None.   COMPLICATIONS:  None immediate.   ESTIMATED BLOOD LOSS:  1000 mL.   ANESTHESIA:  Epidural.   SPECIMENS:  Cord bloods.   OPERATIVE FINDINGS:  Term infant female delivered in vertex presentation,  Apgar and weight per delivery room record, no gross abnormalities.  Baby  cried spontaneously at delivery.  Nuchal cord x1.  Amniotic fluid clear,  nonfoul smelling.  Uterus was complete, three-vessel cord, central  insertion.   DESCRIPTION OF PROCEDURE:  Patient prepped and draped in usual fashion  and placed in left lateral supine position.  Betadine soap was used for  antiseptic and the patient was catheterized prior to the procedure.  After adequate epidural analgesia, Pfannenstiel incision was made and  the abdomen opened.  The lower uterine segment was incised transversely  after developing the bladder flap.  Baby delivered.  Cord clamped and  cut and infant given to the pediatric staff after bulb suctioning.  Placenta removed manually.  Uterus inspected.  Closed the uterine  musculature in one layer with 0-0 Vicryl running interlocking suture.  A  bilateral tubal ligation was performed in a Pomeroy method.   Either tube  was grasped at the isthmus ampullary junction.  A few centimeters of  tube were incorporated with 2-0 plain catgut suture ties x2.  Tubes cut  above said knot and tips were cauterized.  Following  this, bleeding points hemostatically checked, blood clots removed from  the abdomen.  Closure of the fascia in one layer with 0-0 Vicryl running  suture and skin staples to the skin.  The patient tolerated the  procedure well, returned to postanesthesia recovery room in excellent  condition.      Ginger Carne, MD  Electronically Signed     SHB/MEDQ  D:  12/20/2007  T:  12/20/2007  Job:  [id number]

## 2011-04-06 NOTE — Discharge Summary (Addendum)
[name], Sierra Strickland                ACCOUNT NO.:  [id number]   MEDICAL RECORD NO.:  [id number]          PATIENT TYPE:  INP   LOCATION:  9111                          FACILITY:  [facility]   PHYSICIAN:  Sierra Bossier, Sierra Strickland        DATE OF BIRTH:  March 17, 1981   DATE OF ADMISSION:  12/19/2007  DATE OF DISCHARGE:                               DISCHARGE SUMMARY   REASON FOR ADMISSION:  High blood pressure and early preeclampsia in a  term pregnancy for induction of labor.   HOSPITAL COURSE:  This is a 30 year old G4, P4-0-0-4 admitted with high  blood pressure and need for labor at 97 and 2 weeks.  Her prenatal care  with the [facility] under the care of Dr. Philipp Deputy.  Onset  was at 11 weeks.  During her labor, she had an NST which was reactive.  She was delivered via cesarean seduction on 12/20/2007 for failure to  progress and the unreassuring fetal heart tones.  EBL for the procedure  was 1000 mL.  There was a nuchal cord x1, and the placenta was manually  removed as stated per procedure.  The placenta was 3-vessel cord.  For  full summary of the procedure, please see operative report.  During the  procedure, she also had a bilateral tubal ligation for contraception.  She delivered a viable female with Apgars 9 at 1 and 10 at 5.  She is  breastfeeding.  Postoperatively she was anemic; however, she was  asymptomatic.   DISCHARGE DIAGNOSIS:  A term pregnancy delivered.   PERTINENT LABORATORY VALUES:  Mom's blood type is O positive.  She is  antibody negative.  Her RPR was negative.  She si rubella immune.  HIV  was negative.  Hepatitis B was negative.  Sickle cell screen was  negative.  She was GBS positive and was given penicillin IV while she  was laboring.  Her postoperative hemoglobin was 7.4 on 12/21/2007.   ____ QA MARKER: 141 ____ [facility]  lifting and use a pelvic rest for the next 6 weeks.   DIET:  Routine.   STATUS:  Well.   MEDICATIONS:  1. Percocet 5/325, 1 to 2 tablets  p.o. q. 4-6 hours p.r.n. pain.  2. Ibuprofen 600 mg, 1 tab p.o. q. 6 hours p.r.n. pain.  3. Colace 100 mg, 1 tab p.o. b.i.d.  4. Prenatal vitamins, 1 tab p.o. daily.  5. Iron sulfate 325 mg, 1 tab p.o. b.i.d.   FOLLOWUP:  She is to follow up in 6 weeks at the [facility].      Sierra Boozer, Sierra Strickland      Sierra Bossier, Sierra Strickland  Electronically Signed    SA/MEDQ  D:  12/22/2007  T:  12/22/2007  Job:  [id number]

## 2011-04-09 NOTE — Group Therapy Note (Signed)
NAMEMATTILYN, CRITES                          ACCOUNT NO.:  [id number]   MEDICAL RECORD NO.:  [id number]                   PATIENT TYPE:  OUT   LOCATION:  [facility]                           FACILITY:  [facility]   PHYSICIAN:  Elsie Lincoln, MD                   DATE OF BIRTH:  [birth year]   DATE OF SERVICE:  01/07/2004                                    CLINIC NOTE   REASON FOR VISIT:  The patient is a 30 year old G3 para 3-0-0-3 who came  here for evaluation for bilateral tubal ligation.  The patient does not want  any more children; however, after explaining the risks of the surgery she  decided not to proceed with the BTL.  She would rather have a 5-year Mirena  IUD.  The patient understands the risks of this procedure include bleeding,  infection, damage to the uterus, although minimal.  The patient understands  that she has to do monthly string checks to make sure the IUD is in place.   PAST MEDICAL HISTORY:  Denies.   PAST SURGICAL HISTORY:  Denies.   GYNECOLOGICAL HISTORY:  NSD x3.  No history of ovarian cysts, fibroid  tumors, sexually transmitted diseases, or Pap smears that are abnormal.  However, we have no records from her three deliveries.   PHYSICAL EXAMINATION:  VITAL SIGNS:  Temperature 98.4, pulse 82, blood  pressure 111/75, weight 139.8, height 5 feet 3 inches.  ABDOMEN:  Soft, nontender, nondistended.  PELVIC:  Genitalia:  Tanner V.  Vagina:  Pink, normal rugae.  No blood or  discharge.  Cervix:  Closed, nontender, multiparous.   ASSESSMENT AND PLAN:  A 30 year old female G3 para 3-0-0-3 for Mirena IUD.   1. GC and chlamydia culture today.  2. Pap smear today.  3. UPT today.  4. Return to clinic for UPT and IUD placement next week.                                               Elsie Lincoln, MD    KL/MEDQ  D:  01/07/2004  T:  01/07/2004  Job:  [id number]

## 2011-06-01 ENCOUNTER — Ambulatory Visit (INDEPENDENT_AMBULATORY_CARE_PROVIDER_SITE_OTHER): Payer: Medicaid Other | Admitting: *Deleted

## 2011-06-01 DIAGNOSIS — Z111 Encounter for screening for respiratory tuberculosis: Secondary | ICD-10-CM

## 2011-06-03 ENCOUNTER — Encounter: Payer: Self-pay | Admitting: Family Medicine

## 2011-06-03 ENCOUNTER — Ambulatory Visit (INDEPENDENT_AMBULATORY_CARE_PROVIDER_SITE_OTHER): Payer: Medicaid Other | Admitting: Family Medicine

## 2011-06-03 VITALS — BP 108/73 | HR 78 | Temp 98.0°F | Ht 62.5 in | Wt 127.7 lb

## 2011-06-03 DIAGNOSIS — Z8489 Family history of other specified conditions: Secondary | ICD-10-CM

## 2011-06-03 DIAGNOSIS — Z23 Encounter for immunization: Secondary | ICD-10-CM

## 2011-06-03 NOTE — Assessment & Plan Note (Signed)
MMR vaccine unknown, needs for job.  Will check measles and rubella titers.  May need MMR

## 2011-06-03 NOTE — Assessment & Plan Note (Signed)
Would like referral to allergist due to mult family members with issues.  No personal symptoms.

## 2011-06-03 NOTE — Progress Notes (Signed)
  Subjective:    Patient ID: Sierra Strickland, female    DOB: 1981-10-03, 30 y.o.   MRN: [id number]  HPI  Pt needs forms filled out for work.  No MMR in computer.  Pt does not remember if she has had this.    Pt wants to be sent to allergist. No current symptoms but has family hx of food and environmental allergies.    Review of Systems    Denies CP, SOB, HA, N/V/D, fever  Objective:   Physical Exam    Vital signs reviewed General appearance - alert, well appearing, and in no distress and oriented to person, place, and time Heart - normal rate, regular rhythm, normal S1, S2, no murmurs, rubs, clicks or gallops Chest - clear to auscultation, no wheezes, rales or rhonchi, symmetric air entry, no tachypnea, retractions or cyanosis Abdomen - soft, nontender, nondistended, no masses or organomegaly Extremities - peripheral pulses normal, no pedal edema, no clubbing or cyanosis     Assessment & Plan:

## 2011-06-04 LAB — RUBELLA SCREEN: Rubella: 43.5 IU/mL — ABNORMAL HIGH

## 2011-06-08 ENCOUNTER — Encounter: Payer: Self-pay | Admitting: Family Medicine

## 2011-06-18 ENCOUNTER — Encounter: Payer: Self-pay | Admitting: Family Medicine

## 2011-06-18 ENCOUNTER — Ambulatory Visit (INDEPENDENT_AMBULATORY_CARE_PROVIDER_SITE_OTHER): Payer: Medicaid Other | Admitting: Family Medicine

## 2011-06-18 VITALS — BP 120/76 | HR 91 | Temp 98.7°F | Ht 63.0 in | Wt 131.6 lb

## 2011-06-18 DIAGNOSIS — N760 Acute vaginitis: Secondary | ICD-10-CM

## 2011-06-18 DIAGNOSIS — B3731 Acute candidiasis of vulva and vagina: Secondary | ICD-10-CM

## 2011-06-18 DIAGNOSIS — B373 Candidiasis of vulva and vagina: Secondary | ICD-10-CM

## 2011-06-18 LAB — POCT WET PREP (WET MOUNT): Trichomonas Wet Prep HPF POC: NEGATIVE

## 2011-06-18 MED ORDER — FLUCONAZOLE 150 MG PO TABS: 150.0000 mg | ORAL_TABLET | Freq: Once | ORAL | Status: AC

## 2011-06-18 NOTE — Assessment & Plan Note (Signed)
Will treat with Diflucan, will not treat BV today likely chronic and asymptomatic.

## 2011-06-18 NOTE — Patient Instructions (Signed)
Diflucan one tab today and one on Sunday Use ibuprofen 2-3 tabs prn for cramps

## 2011-06-18 NOTE — Progress Notes (Signed)
  Subjective:    Patient ID: Sierra Strickland, female    DOB: 02/10/1981, 30 y.o.   MRN: [id number]  HPI  Thick white vaginal discharge.  Dull pain in the area of her right ovary. Had a BTL after a C section 3 years ago.  Menses are irregular.    Review of Systems  Constitutional: Negative for chills.  Genitourinary: Positive for vaginal discharge and pelvic pain. Negative for dysuria, vaginal pain and dyspareunia.       Objective:   Physical Exam  Constitutional: She appears well-developed and well-nourished.  Genitourinary:       Normal external exam, large amount of white thick discharge. + clues, + yeast No CMT, mild right adnexal tenderness, no masses          Assessment & Plan:   No problem-specific assessment & plan notes found for this encounter.

## 2011-06-21 ENCOUNTER — Encounter: Payer: Self-pay | Admitting: Family Medicine

## 2011-08-11 LAB — URINALYSIS, ROUTINE W REFLEX MICROSCOPIC
Glucose, UA: NEGATIVE
Ketones, ur: NEGATIVE
Protein, ur: NEGATIVE

## 2011-08-12 LAB — URINALYSIS, ROUTINE W REFLEX MICROSCOPIC
Bilirubin Urine: NEGATIVE
Glucose, UA: NEGATIVE
Ketones, ur: NEGATIVE
Nitrite: NEGATIVE
pH: 6

## 2011-08-12 LAB — CBC
HCT: 31.5 — ABNORMAL LOW
HCT: 32.4 — ABNORMAL LOW
Hemoglobin: 10.5 — ABNORMAL LOW
Hemoglobin: 10.7 — ABNORMAL LOW
MCHC: 33.3
MCHC: 34.1
MCV: 85.3
Platelets: 195
Platelets: 257
RDW: 16.6 — ABNORMAL HIGH
RDW: 17.5 — ABNORMAL HIGH
WBC: 11.3 — ABNORMAL HIGH
WBC: 8.6

## 2011-08-12 LAB — COMPREHENSIVE METABOLIC PANEL
ALT: 16
AST: 25
Alkaline Phosphatase: 101
BUN: 8
CO2: 20
Calcium: 8.3 — ABNORMAL LOW
Glucose, Bld: 76
Glucose, Bld: 92
Potassium: 3.7
Sodium: 135
Total Protein: 5.6 — ABNORMAL LOW
Total Protein: 6

## 2011-08-12 LAB — URINALYSIS, DIPSTICK ONLY
Hgb urine dipstick: NEGATIVE
Ketones, ur: NEGATIVE
Protein, ur: NEGATIVE
Urobilinogen, UA: 0.2

## 2011-08-12 LAB — URIC ACID
Uric Acid, Serum: 4.3
Uric Acid, Serum: 4.5

## 2011-08-31 LAB — URINALYSIS, ROUTINE W REFLEX MICROSCOPIC
Bilirubin Urine: NEGATIVE
Ketones, ur: 15 — AB
Nitrite: NEGATIVE
Protein, ur: NEGATIVE
Urobilinogen, UA: 0.2
pH: 6.5

## 2011-08-31 LAB — GC/CHLAMYDIA PROBE AMP, GENITAL: Chlamydia, DNA Probe: NEGATIVE

## 2011-08-31 LAB — FETAL FIBRONECTIN: Fetal Fibronectin: NEGATIVE

## 2011-09-22 ENCOUNTER — Telehealth: Payer: Self-pay | Admitting: Family Medicine

## 2011-09-22 ENCOUNTER — Encounter: Payer: Self-pay | Admitting: Family Medicine

## 2011-09-22 ENCOUNTER — Ambulatory Visit (INDEPENDENT_AMBULATORY_CARE_PROVIDER_SITE_OTHER): Payer: Medicaid Other | Admitting: Family Medicine

## 2011-09-22 VITALS — BP 114/70 | HR 77 | Temp 98.1°F | Ht 63.0 in | Wt 133.0 lb

## 2011-09-22 DIAGNOSIS — N760 Acute vaginitis: Secondary | ICD-10-CM

## 2011-09-22 DIAGNOSIS — F17200 Nicotine dependence, unspecified, uncomplicated: Secondary | ICD-10-CM

## 2011-09-22 DIAGNOSIS — K0889 Other specified disorders of teeth and supporting structures: Secondary | ICD-10-CM

## 2011-09-22 DIAGNOSIS — N898 Other specified noninflammatory disorders of vagina: Secondary | ICD-10-CM

## 2011-09-22 DIAGNOSIS — K089 Disorder of teeth and supporting structures, unspecified: Secondary | ICD-10-CM

## 2011-09-22 LAB — POCT WET PREP (WET MOUNT): WBC, Wet Prep HPF POC: <5

## 2011-09-22 MED ORDER — FLUCONAZOLE 150 MG PO TABS: 150.0000 mg | ORAL_TABLET | Freq: Once | ORAL | Status: AC

## 2011-09-22 NOTE — Telephone Encounter (Signed)
Left VM- Called to tell pt yeast infection.  Sent diflucan to pharmacy

## 2011-09-22 NOTE — Patient Instructions (Signed)
I will call you at [phone] if anything shows up on your tests If everything is normal, you will get a letter.

## 2011-09-23 DIAGNOSIS — K0889 Other specified disorders of teeth and supporting structures: Secondary | ICD-10-CM | POA: Insufficient documentation

## 2011-09-23 LAB — GC/CHLAMYDIA PROBE AMP, GENITAL: Chlamydia, DNA Probe: NEGATIVE

## 2011-09-23 NOTE — Assessment & Plan Note (Signed)
New vaginal discharge. There is some mild irritation with this. No burning or smell. She has a new sexual partner x1 year. They do not use condoms and she is not sure they are monogamous. She does not think this discharge is bad enough to treat.

## 2011-09-23 NOTE — Assessment & Plan Note (Signed)
Patient had sharp dental pain 2 weeks ago. She was seen by dentist and started on amoxicillin. She started feeling better 3 days after starting her amoxicillin. No further workup for this.

## 2011-09-23 NOTE — Assessment & Plan Note (Signed)
Patient smokes one to 2 cigarettes per day. She is a frequent marijuana user daily.

## 2011-09-23 NOTE — Progress Notes (Signed)
  Subjective:    Patient ID: Sierra Strickland, female    DOB: 15-Dec-1980, 30 y.o.   MRN: [id number]  HPI Patient with a new discharge. She has some mild irritation with this but no smell or burning. She has a new sexual partner x1 year. She has a BTL for contraception. She does not use condoms with partner and she's not sure they're monogamous. She would like to be checked for STDs today.   Review of Systems Denies CP, SOB, HA, N/V/D, fever     Objective:   Physical Exam  Vital signs reviewed General appearance - alert, well appearing, and in no distress and oriented to person, place, and time Heart - normal rate, regular rhythm, normal S1, S2, no murmurs, rubs, clicks or gallops Chest - clear to auscultation, no wheezes, rales or rhonchi, symmetric air entry, no tachypnea, retractions or cyanosis GYN- external genetalia normal, without lesions.  Vagina normal color, rugations, without discharge.  Cervix normal color without lesions or discharge.       Assessment & Plan:

## 2011-09-23 NOTE — Telephone Encounter (Signed)
Pt informed of yeast infection Fleeger, Sierra Strickland

## 2011-10-19 ENCOUNTER — Ambulatory Visit: Payer: Medicaid Other | Admitting: Family Medicine

## 2011-12-16 ENCOUNTER — Ambulatory Visit (INDEPENDENT_AMBULATORY_CARE_PROVIDER_SITE_OTHER): Payer: Medicaid Other | Admitting: Family Medicine

## 2011-12-16 ENCOUNTER — Encounter: Payer: Self-pay | Admitting: Family Medicine

## 2011-12-16 DIAGNOSIS — N898 Other specified noninflammatory disorders of vagina: Secondary | ICD-10-CM

## 2011-12-16 DIAGNOSIS — N760 Acute vaginitis: Secondary | ICD-10-CM

## 2011-12-16 LAB — POCT WET PREP (WET MOUNT)
WBC, Wet Prep HPF POC: >20
Yeast Wet Prep HPF POC: NEGATIVE

## 2011-12-16 NOTE — Patient Instructions (Addendum)
Thank you for coming into the office today. Your exam was normal today. I will let you know if any of your lab results come back abnormal. Please follow-up w/ Dr. Hulen Luster as needed. Have a great day.

## 2011-12-17 ENCOUNTER — Telehealth: Payer: Self-pay | Admitting: Family Medicine

## 2011-12-17 DIAGNOSIS — N898 Other specified noninflammatory disorders of vagina: Secondary | ICD-10-CM

## 2011-12-17 LAB — GC/CHLAMYDIA PROBE AMP, GENITAL: GC Probe Amp, Genital: NEGATIVE

## 2011-12-17 MED ORDER — METRONIDAZOLE 500 MG PO TABS: 500.0000 mg | ORAL_TABLET | Freq: Two times a day (BID) | ORAL | Status: AC

## 2011-12-17 NOTE — Assessment & Plan Note (Addendum)
Clue cells on wet prep. Trich neg. GC/Chlamydia negative. Will treat w/ Metro 500mg  BID for 7 days

## 2011-12-17 NOTE — Progress Notes (Signed)
  Subjective:    Patient ID: Sierra Strickland, female    DOB: 01-24-1981, 31 y.o.   MRN: [id number]  HPI   CC: Vaginal discharge  Vaginal discharge: scant white non-smelly discharge for the past week. Non-itchy/bloody. No dyspnurea or vaginal irritation. No change in sexual partner for the past 2 years. Treated for similar symptoms in October w/ yeast infection. Yeast infection resolved w/ treatment in October per pt. PT concerned about STD and would like to be tested     Review of Systems Denies: CP, sob, HA, syncope, lightheadedness, dysuria, abdominal pain, hematochezia, n/v/d, constipation    Objective:   Physical Exam  Abd: soft non tender GU: scant milky mucous along vaginal wall. No erythema. No smell. Cervix normal appearing.  CV: RRR no m/r/g   Results for orders placed in visit on 12/16/11 (from the past 24 hour(s))  GC/CHLAMYDIA PROBE AMP, GENITAL     Status: Normal   Collection Time   12/16/11  4:09 PM      Component Value Range   Chlamydia, DNA Probe NEGATIVE     GC Probe Amp, Genital NEGATIVE     Narrative:    Performed at:  [facility]                [address], [address]                [place], [place] [address]  POCT WET PREP (WET MOUNT)     Status: Abnormal   Collection Time   12/16/11  4:11 PM      Component Value Range   Source Wet Prep POC VAG     WBC, Wet Prep HPF POC >20     Bacteria Wet Prep HPF POC 3+ COCCI     Clue Cells, Wet Prep MANY     Yeast, Wet Prep NEG     Trichomonas Wet Prep HPF POC NEG          Assessment & Plan:

## 2011-12-17 NOTE — Assessment & Plan Note (Signed)
Pt opting no to fill abx at this time due to prior yeast infection after abx for prior BV infection.

## 2011-12-17 NOTE — Telephone Encounter (Signed)
Called to inform pt of lab results. Pt states that the last time she was treated for BV W/ Abx she developed a yeast infection. Risks benefits discussed w/ pt. Who opted not to fill her Abx at this time as symptoms are so mild

## 2012-01-13 ENCOUNTER — Ambulatory Visit (INDEPENDENT_AMBULATORY_CARE_PROVIDER_SITE_OTHER): Payer: Medicaid Other | Admitting: Family Medicine

## 2012-01-13 ENCOUNTER — Encounter: Payer: Self-pay | Admitting: Family Medicine

## 2012-01-13 VITALS — BP 106/81 | HR 68 | Temp 98.3°F | Ht 62.0 in | Wt 136.1 lb

## 2012-01-13 DIAGNOSIS — B359 Dermatophytosis, unspecified: Secondary | ICD-10-CM

## 2012-01-13 DIAGNOSIS — R21 Rash and other nonspecific skin eruption: Secondary | ICD-10-CM

## 2012-01-13 MED ORDER — KETOCONAZOLE 2 % EX CREA: TOPICAL_CREAM | Freq: Every day | CUTANEOUS | Status: AC

## 2012-01-13 NOTE — Patient Instructions (Signed)
Ringworm, Body [Tinea Corporis] Ringworm is a fungal infection of the skin and hair. Another name for this problem is Tinea Corporis. It has nothing to do with worms. A fungus is an organism that lives on dead cells (the outer layer of skin). It can involve the entire body. It can spread from infected pets. Tinea corporis can be a problem in wrestlers who may get the infection form other players/opponents, equipment and mats. DIAGNOSIS  A skin scraping can be obtained from the affected area and by looking for fungus under the microscope. This is called a KOH examination.  HOME CARE INSTRUCTIONS   Ringworm may be treated with a topical antifungal cream, ointment, or oral medications.   If you are using a cream or ointment, wash infected skin. Dry it completely before application.   Scrub the skin with a buff puff or abrasive sponge using a shampoo with ketoconazole to remove dead skin and help treat the ringworm.   Have your pet treated by your veterinarian if it has the same infection.  SEEK MEDICAL CARE IF:   Your ringworm patch (fungus) continues to spread after 7 days of treatment.   Your rash is not gone in 4 weeks. Fungal infections are slow to respond to treatment. Some redness (erythema) may remain for several weeks after the fungus is gone.   The area becomes red, warm, tender, and swollen beyond the patch. This may be a secondary bacterial (germ) infection.   You have a fever.  Document Released: 11/05/2000 Document Revised: 07/21/2011 Document Reviewed: 04/18/2009 Exit[employer] Patient Information 2012 [employer]. 

## 2012-01-13 NOTE — Assessment & Plan Note (Signed)
KOH today negative, likely partially treated OTC.  Have prescribed topical ketoconazole for empiric treatment, given red flags for return

## 2012-01-13 NOTE — Progress Notes (Signed)
Sierra Strickland is a 31 y.o. female with no significant PMHx who presents to [facility] today for evaluation of rash.  Pt states that she noticed a small dime-sized red itchy lesion on her left arm on Friday.  Since then, it has spread to her left eye and down her left arm.  She denies any burning, tingling, or discharge from the areas.  She reports that [facility] cream makes it feel better.  She denies any fever, chills, nausea, vomiting, diarrhea.   PMH reviewed.  ROS as above otherwise neg Medications reviewed. No current outpatient prescriptions on file.    Exam:  BP 106/81  Pulse 68  Temp(Src) 98.3 F (36.8 C) (Oral)  Ht 5\' 2"  (1.575 m)  Wt 136 lb 1.6 oz (61.735 kg)  BMI 24.89 kg/m2  LMP 12/24/2011 Gen: Well NAD Lungs: CTABL Nl WOB Heart: RRR no MRG Skin: two dime-sized erythematous plaques with raised borders on left forearm, no surrounding edema or erythema; left eye has similar appearing papules  Assessment and Plan: Sierra Strickland is a 31 yo F with no significant PMHx who presents to PCP with itchy rash.  Evaluation including history and physical indicate a common fungal infection.  1. Topical ketoconazole 2% cream daily 2. Return if you have any signs of infection or the rash does not resolve  Gerlene Fee MS3

## 2012-01-13 NOTE — Progress Notes (Signed)
  Subjective:    Patient ID: Sierra Strickland, female    DOB: 06/14/1981, 31 y.o.   MRN: [id number]  HPI  Agree with history, assessment, and plan as documented by MS3.   HPI: less than 1 week of annular lesions, started as singular lesion on left forearm, now has additional lesion on left forearm and one on corner of left face lateral to eye.  Very itchy.   No pet exposure. No household contacts affected.  No obvious contact irritatnts.  No fever.  PE:  Skin:  Two less than 1 cm annular lesions on left forearm, scaly. Small lesion similar lateral to left eye.  Labs: KOH negative, possibly partially treated.  Review of Systems     Objective:   Physical Exam        Assessment & Plan:

## 2012-03-08 ENCOUNTER — Ambulatory Visit (INDEPENDENT_AMBULATORY_CARE_PROVIDER_SITE_OTHER): Payer: Medicaid Other | Admitting: Family Medicine

## 2012-03-08 ENCOUNTER — Other Ambulatory Visit (HOSPITAL_COMMUNITY)
Admission: RE | Admit: 2012-03-08 | Discharge: 2012-03-08 | Disposition: A | Discharge status: Home / Self Care | Payer: Medicaid Other | Source: Ambulatory Visit | Attending: Family Medicine | Admitting: Family Medicine

## 2012-03-08 ENCOUNTER — Encounter: Payer: Self-pay | Admitting: Family Medicine

## 2012-03-08 VITALS — BP 118/82 | HR 83 | Temp 98.5°F | Ht 62.0 in | Wt 135.3 lb

## 2012-03-08 DIAGNOSIS — Z113 Encounter for screening for infections with a predominantly sexual mode of transmission: Secondary | ICD-10-CM | POA: Insufficient documentation

## 2012-03-08 DIAGNOSIS — N760 Acute vaginitis: Secondary | ICD-10-CM

## 2012-03-08 DIAGNOSIS — N898 Other specified noninflammatory disorders of vagina: Secondary | ICD-10-CM

## 2012-03-08 LAB — POCT WET PREP (WET MOUNT)
Clue Cells Wet Prep Whiff POC: POSITIVE
WBC, Wet Prep HPF POC: <5

## 2012-03-08 NOTE — Patient Instructions (Signed)
We will call you with the results of your test.  One of them will return a little later today, the other will be sometime tomorrow.   We'll decide on what to do at that point.    It was good to meet you.

## 2012-03-09 NOTE — Assessment & Plan Note (Signed)
No actual discharge noted wet prep today. Did obtain wet prep today. Will call patient with results.

## 2012-03-09 NOTE — Progress Notes (Signed)
  Subjective:    Patient ID: Sierra Strickland, female    DOB: [birth year], 31 y.o.   MRN: [id number]  HPI Vaginal discharge: She's had some minor vaginal itching as well. Describes a clear to white discharge worse when she wipes and said that she's noticed on her panties as well. No abdominal pain. No nausea or vomiting. No new sexual contacts. She does use condoms with her husband at times. She does have bilateral tubal ligation.   Review of Systems See HPI above for review of systems.       Objective:   Physical Exam  Gen:  Alert, cooperative patient who appears stated age in no acute distress.  Vital signs reviewed. Abd:  Soft/nondistended/nontender.  Good bowel sounds throughout all four quadrants.  No masses noted.  GYN:  External genitalia within normal limits.  Vaginal mucosa pink, moist, normal rugae.  Nonfriable cervix without lesions, no discharge or bleeding noted on speculum exam.  Bimanual exam revealed normal, nongravid uterus.  No cervical motion tenderness. No adnexal masses bilaterally.          Assessment & Plan:

## 2012-03-10 ENCOUNTER — Ambulatory Visit (INDEPENDENT_AMBULATORY_CARE_PROVIDER_SITE_OTHER): Payer: Medicaid Other | Admitting: *Deleted

## 2012-03-10 ENCOUNTER — Other Ambulatory Visit: Payer: Self-pay | Admitting: Family Medicine

## 2012-03-10 ENCOUNTER — Telehealth: Payer: Self-pay | Admitting: Family Medicine

## 2012-03-10 DIAGNOSIS — A749 Chlamydial infection, unspecified: Secondary | ICD-10-CM

## 2012-03-10 DIAGNOSIS — A7489 Other chlamydial diseases: Secondary | ICD-10-CM

## 2012-03-10 MED ORDER — METRONIDAZOLE 500 MG PO TABS: 500.0000 mg | ORAL_TABLET | Freq: Two times a day (BID) | ORAL | Status: DC

## 2012-03-10 MED ORDER — AZITHROMYCIN 1 G PO PACK
1.0000 g | PACK | Freq: Once | ORAL | Status: AC
  Administered 2012-03-10: 1 g via ORAL

## 2012-03-10 MED ORDER — AZITHROMYCIN 1 G PO PACK: 1.0000 | PACK | Freq: Once | ORAL | Status: AC

## 2012-03-10 NOTE — Progress Notes (Signed)
Communicable Disease report faxed to [facility]. 

## 2012-03-10 NOTE — Progress Notes (Signed)
Patient in for STD treatment. Advised to tell partners to get treated. Abstain from sex for 5 days. Use condoms to prevent STD.

## 2012-03-10 NOTE — Telephone Encounter (Signed)
Patient would like to speak to a nurse about her medication.

## 2012-03-10 NOTE — Telephone Encounter (Signed)
Called and related both Chlamydia and bacterial vaginosis diagnoses. We'll send in Flagyl. Nurse visit made for azithromycin slurry.

## 2012-03-10 NOTE — Telephone Encounter (Signed)
Patient needed to discuss details of contracting STD. Tried to help with her questions.

## 2012-05-17 ENCOUNTER — Encounter: Payer: Self-pay | Admitting: Family Medicine

## 2012-05-17 ENCOUNTER — Other Ambulatory Visit (HOSPITAL_COMMUNITY)
Admission: RE | Admit: 2012-05-17 | Discharge: 2012-05-17 | Disposition: A | Discharge status: Home / Self Care | Payer: Medicaid Other | Source: Ambulatory Visit | Attending: Family Medicine | Admitting: Family Medicine

## 2012-05-17 ENCOUNTER — Ambulatory Visit (INDEPENDENT_AMBULATORY_CARE_PROVIDER_SITE_OTHER): Payer: Medicaid Other | Admitting: Family Medicine

## 2012-05-17 VITALS — BP 115/76 | HR 82 | Temp 99.2°F | Ht 62.0 in | Wt 136.0 lb

## 2012-05-17 DIAGNOSIS — Z113 Encounter for screening for infections with a predominantly sexual mode of transmission: Secondary | ICD-10-CM | POA: Insufficient documentation

## 2012-05-17 DIAGNOSIS — K047 Periapical abscess without sinus: Secondary | ICD-10-CM | POA: Insufficient documentation

## 2012-05-17 DIAGNOSIS — N898 Other specified noninflammatory disorders of vagina: Secondary | ICD-10-CM

## 2012-05-17 LAB — POCT WET PREP (WET MOUNT): Clue Cells Wet Prep Whiff POC: POSITIVE

## 2012-05-17 MED ORDER — AMOXICILLIN-POT CLAVULANATE 875-125 MG PO TABS: 1.0000 | ORAL_TABLET | Freq: Two times a day (BID) | ORAL | Status: AC

## 2012-05-17 MED ORDER — FLUCONAZOLE 150 MG PO TABS: 150.0000 mg | ORAL_TABLET | Freq: Once | ORAL | Status: AC

## 2012-05-17 NOTE — Patient Instructions (Addendum)
Please go see your dentist for evaluation of your jaw pain I have started Augmentin today to take twice a day Today we treated you for a yeast infection We will call if there is anything else to treat

## 2012-05-18 ENCOUNTER — Encounter: Payer: Self-pay | Admitting: Family Medicine

## 2012-05-18 NOTE — Assessment & Plan Note (Signed)
Check for STDs today. Prescribed Diflucan. Will call if needs further treatment.

## 2012-05-18 NOTE — Assessment & Plan Note (Signed)
Small dental abscess on her left lower molar. Advised to see dentist fairly soon. Start antibiotics today.

## 2012-05-18 NOTE — Progress Notes (Signed)
  Subjective:    Patient ID: Sierra Strickland, female    DOB: December 29, 1980, 31 y.o.   MRN: [id number]  HPI  Vaginal discharge-patient complains of thick white discharge that is itchy for the last 3 days. She denies odor. She is concerned about STDs because she is not using condoms with her partner every time. She says that the condoms are irritating to her. She is not having any abdominal pain or fevers.  Jaw pain-patient has had 2 days of swelling in her left lower jaw. She has noticed some small amount of discharge and her mouth. She has not had fevers but she is having pain. She is taking Aleve for the pain as well as applying warm compresses to the outside of her mouth. She has a Education officer, community and she thinks she could see him fairly quickly.  History of dental problems. In a monogamous relationship. Review of Systems See above    Objective:   Physical Exam Vital signs reviewed General appearance - alert, well appearing, and in no distress Heart - normal rate, regular rhythm, normal S1, S2, no murmurs, rubs, clicks or gallops Chest - clear to auscultation, no wheezes, rales or rhonchi, symmetric air entry, no tachypnea, retractions or cyanosis Mouth-mild induration of the left lower gums with a white pustule below the first molar GYN- external genetalia normal, without lesions.  Vagina normal color, rugations, with thick white discharge.  Cervix normal color without lesions or discharge.       Assessment & Plan:

## 2012-05-19 ENCOUNTER — Encounter: Payer: Self-pay | Admitting: Family Medicine

## 2012-07-03 ENCOUNTER — Telehealth: Payer: Self-pay | Admitting: Family Medicine

## 2012-07-03 NOTE — Telephone Encounter (Signed)
Patient is calling because she had a tooth pulled due to an abscess last week and was prescribed Amoxicillan which has not caused a yeast infection and she is hoping that something can be called into [facility] on [address].  I told her that since she has a new MD, I didn't know if from her previous records, her new MD would be willing to call that in.

## 2012-07-03 NOTE — Telephone Encounter (Signed)
Patient was not happy about the response.  She has never had a problem getting a refill on the 1 time pill before.  She has taken abx before and they always cause a yeast infection.  I attempted to explain to her that since Dr. Madolyn Frieze hasn't seen her, she really needs to be seen for an Rx to be sent in for her.

## 2012-07-03 NOTE — Telephone Encounter (Signed)
Please have her try OTC preparation (Gyne-Lotrimin or Monistat) as prescribed.  If her symptoms persistent, please advise her to come in. Thank you.

## 2012-09-04 ENCOUNTER — Encounter: Payer: Self-pay | Admitting: Family Medicine

## 2012-09-04 ENCOUNTER — Ambulatory Visit (INDEPENDENT_AMBULATORY_CARE_PROVIDER_SITE_OTHER): Payer: Medicaid Other | Admitting: Family Medicine

## 2012-09-04 ENCOUNTER — Other Ambulatory Visit (HOSPITAL_COMMUNITY)
Admission: RE | Admit: 2012-09-04 | Discharge: 2012-09-04 | Disposition: A | Discharge status: Home / Self Care | Payer: Medicaid Other | Source: Ambulatory Visit | Attending: Family Medicine | Admitting: Family Medicine

## 2012-09-04 DIAGNOSIS — Z113 Encounter for screening for infections with a predominantly sexual mode of transmission: Secondary | ICD-10-CM | POA: Insufficient documentation

## 2012-09-04 DIAGNOSIS — N898 Other specified noninflammatory disorders of vagina: Secondary | ICD-10-CM

## 2012-09-04 DIAGNOSIS — N760 Acute vaginitis: Secondary | ICD-10-CM

## 2012-09-04 LAB — POCT WET PREP (WET MOUNT)

## 2012-09-04 MED ORDER — METRONIDAZOLE 500 MG PO TABS: 500.0000 mg | ORAL_TABLET | Freq: Two times a day (BID) | ORAL | Status: AC

## 2012-09-04 NOTE — Progress Notes (Signed)
S: Pt comes in today for SDA for vaginal d/c x 4 days.  D/c is clear, watery and thin.  She has had BV previously.  She has also had chlamydia and wants to make sure this isn't an STD.  Has had some mild LLQ pain. No vaginal d/c or discomfort, no itching, no pain with urination or intercourse.  S/p BTL.    ROS: Per HPI  History  Smoking status  . Current Every Day Smoker  Smokeless tobacco  . Never Used    O: There were no vitals filed for this visit.  Gen: NAD Abd: soft, NT Pelvic: normal external genitalia, small amount of thin, nonmalodorous discharge, cervix appears healthy without erythema or irritability, no CMT, uterus and adnexa normal without tenderness   A/P: 31 y.o. female p/w vag d/c, BV -See problem list -f/u in PRN

## 2012-09-04 NOTE — Assessment & Plan Note (Signed)
Wet prep c/w BV, will treat with flagyl. GC/chlam pending.  Phone number for pt is: [phone]

## 2012-09-05 ENCOUNTER — Telehealth: Payer: Self-pay | Admitting: Family Medicine

## 2012-09-05 NOTE — Telephone Encounter (Signed)
Please call pt and let her know that her gonorrhea and chlamydia are negative. Thanks!

## 2012-09-06 NOTE — Telephone Encounter (Signed)
Left message for patient to return call. See message below from Dr Fara Boros. Please also tell patient she does have BV and Rx has been sent to her pharmacy.[place], Rodena Medin

## 2012-09-20 ENCOUNTER — Telehealth: Payer: Self-pay | Admitting: Family Medicine

## 2012-09-20 MED ORDER — FLUCONAZOLE 150 MG PO TABS: 150.0000 mg | ORAL_TABLET | Freq: Once | ORAL | Status: DC

## 2012-09-20 NOTE — Telephone Encounter (Signed)
Patient was treated for a bacterial infection and that medication has caused her to have a yeast infection and she would like some medication sent to her pharmacy to treat that.  She uses [facility] on [address].

## 2012-09-20 NOTE — Telephone Encounter (Signed)
Please let pt know that a medicine was sent. Take 1 tab by mouth 1 time. Thanks!

## 2012-09-20 NOTE — Telephone Encounter (Signed)
Pt informed. Sierra Strickland  

## 2012-10-30 ENCOUNTER — Ambulatory Visit (INDEPENDENT_AMBULATORY_CARE_PROVIDER_SITE_OTHER): Payer: Medicaid Other | Admitting: Family Medicine

## 2012-10-30 ENCOUNTER — Other Ambulatory Visit (HOSPITAL_COMMUNITY)
Admission: RE | Admit: 2012-10-30 | Discharge: 2012-10-30 | Disposition: A | Discharge status: Home / Self Care | Payer: Medicaid Other | Source: Ambulatory Visit | Attending: Family Medicine | Admitting: Family Medicine

## 2012-10-30 ENCOUNTER — Encounter: Payer: Self-pay | Admitting: Family Medicine

## 2012-10-30 VITALS — BP 111/65 | HR 62 | Temp 98.3°F | Ht 62.0 in | Wt 131.1 lb

## 2012-10-30 DIAGNOSIS — N760 Acute vaginitis: Secondary | ICD-10-CM

## 2012-10-30 DIAGNOSIS — Z113 Encounter for screening for infections with a predominantly sexual mode of transmission: Secondary | ICD-10-CM | POA: Insufficient documentation

## 2012-10-30 LAB — POCT WET PREP (WET MOUNT)

## 2012-10-30 MED ORDER — METRONIDAZOLE 500 MG PO TABS: 500.0000 mg | ORAL_TABLET | Freq: Two times a day (BID) | ORAL | Status: DC

## 2012-10-30 NOTE — Patient Instructions (Signed)
You have an overgrowth of bacteria in your vagina. This is not a sexually transmitted infection.  Sexual partners do not need to be treated, however abstaining from sex or using condoms may prevent recurrence of the overgrowth. °Some women have a recurrence of the overgrowth even when fully treated.  Call the office if your symptoms begin again.  Do not douche.  This is associated with decreased cure rates and more bacterial overgrowths. °Take the full course of the antibiotic prescribed to you even if you begin to feel better. °Do not drink alcohol with flagyl as the drug will cause an upset stomach if you do drink. °  °

## 2012-10-30 NOTE — Progress Notes (Signed)
Patient ID: Sierra Strickland, female   DOB: [birth year], 31 y.o.   MRN: [id number] SUBJECTIVE:  31 y.o. female complains of left lower abd to pelvic pain, back pain on the left side and thin vaginal discharge for 1 week(s). Denies abnormal vaginal bleeding or fever. No UTI symptoms. Denies history of known exposure to STD although she does have 2 partners.  Patient's last menstrual period was 10/21/2012.  OBJECTIVE:  She appears well, afebrile. Abdomen: benign, soft, nontender, no masses. Pelvic Exam: normal external genitalia, vulva, vagina, cervix, uterus and adnexa, slight clear discharge.  ASSESSMENT:  bacterial vaginosis  PLAN:  GC and chlamydia DNA  probe sent to lab. Treatment: Flagyl 500 BID x 7 days and abstain from coitus during course of treatment ROV prn if symptoms persist or worsen.

## 2012-11-01 ENCOUNTER — Encounter: Payer: Self-pay | Admitting: Family Medicine

## 2013-04-04 ENCOUNTER — Encounter: Payer: Self-pay | Admitting: Family Medicine

## 2013-04-04 ENCOUNTER — Other Ambulatory Visit (HOSPITAL_COMMUNITY)
Admission: RE | Admit: 2013-04-04 | Discharge: 2013-04-04 | Disposition: A | Discharge status: Home / Self Care | Payer: Medicaid Other | Source: Ambulatory Visit | Attending: Family Medicine | Admitting: Family Medicine

## 2013-04-04 ENCOUNTER — Ambulatory Visit (INDEPENDENT_AMBULATORY_CARE_PROVIDER_SITE_OTHER): Payer: Medicaid Other | Admitting: Family Medicine

## 2013-04-04 VITALS — BP 102/65 | HR 83 | Temp 99.0°F | Ht 62.0 in | Wt 129.9 lb

## 2013-04-04 DIAGNOSIS — F17200 Nicotine dependence, unspecified, uncomplicated: Secondary | ICD-10-CM

## 2013-04-04 DIAGNOSIS — Z0000 Encounter for general adult medical examination without abnormal findings: Secondary | ICD-10-CM | POA: Insufficient documentation

## 2013-04-04 DIAGNOSIS — Z113 Encounter for screening for infections with a predominantly sexual mode of transmission: Secondary | ICD-10-CM | POA: Insufficient documentation

## 2013-04-04 LAB — HEPATITIS B SURFACE ANTIGEN: Hepatitis B Surface Ag: NEGATIVE

## 2013-04-04 LAB — HIV ANTIBODY (ROUTINE TESTING W REFLEX): HIV: NONREACTIVE

## 2013-04-04 NOTE — Patient Instructions (Signed)
Happy birthday! Thank you for coming in especially today.   If your lab results are normal, I will send you a letter with the results. If abnormal, someone at the clinic will get in touch with you.   Follow-up in 1 year or sooner if needed.

## 2013-04-04 NOTE — Assessment & Plan Note (Signed)
Pap smear today  HIV, hepatitis B, GC/Chlamydia, RPR She continues to smoke marijuana. She is not interested in quitting.  Follow-up in 1 year

## 2013-04-04 NOTE — Assessment & Plan Note (Signed)
See preventative A/P 

## 2013-04-04 NOTE — Progress Notes (Signed)
  Subjective:    Patient ID: Sierra Strickland, female    DOB: Jun 07, 1981, 32 y.o.   MRN: [id number]  HPI # Physical Sexually active with one female partner; does not use condoms  She is s/p tubal.   Regular monthly periods; not too heavy   Review of Systems Denies vaginal discharge or irritation, fevers/chills/nausea, depression, breast pain, tenderness, abnormal discharge  Allergies, medication, past medical history reviewed.  Smoking status noted. Marijuana.   Shares custody of her kids with her father.      Objective:   Physical Exam GEN: NAD; well-nourished, -appearing CV: RRR, no m/r/g PSYCH: pleasant PULM: NI WOB; CTAB without w/r/r ABD: soft, NT, ND EXT: no edema NEURO: no deficits; moves all extremities well  GU:    Vulva: no lesions   Vagina: non-tender, -erythematous; no fishy odor   Cervix: no lesions   Rectum: no hemorrhoids     Assessment & Plan:

## 2013-04-06 ENCOUNTER — Encounter: Payer: Self-pay | Admitting: Family Medicine

## 2013-06-26 ENCOUNTER — Ambulatory Visit (INDEPENDENT_AMBULATORY_CARE_PROVIDER_SITE_OTHER): Payer: Medicaid Other | Admitting: Family Medicine

## 2013-06-26 ENCOUNTER — Encounter: Payer: Self-pay | Admitting: Family Medicine

## 2013-06-26 VITALS — BP 116/77 | HR 99 | Temp 100.1°F | Ht 62.0 in | Wt 128.2 lb

## 2013-06-26 DIAGNOSIS — J020 Streptococcal pharyngitis: Secondary | ICD-10-CM

## 2013-06-26 DIAGNOSIS — R599 Enlarged lymph nodes, unspecified: Secondary | ICD-10-CM

## 2013-06-26 DIAGNOSIS — J029 Acute pharyngitis, unspecified: Secondary | ICD-10-CM | POA: Insufficient documentation

## 2013-06-26 DIAGNOSIS — R590 Localized enlarged lymph nodes: Secondary | ICD-10-CM

## 2013-06-26 NOTE — Assessment & Plan Note (Signed)
Patient presents with acute sore throat and lymphadenopathy, rapid strep negative, group A strep probe sent for confirmatory testing, suspect pharyngitis is likely secondary to viral cause -Conservative measures pursued as outlined in patient instructions

## 2013-06-26 NOTE — Progress Notes (Signed)
  Subjective:    Patient ID: Sierra Strickland, female    DOB: 09/13/1981, 32 y.o.   MRN: [id number]  HPI 32 year old female presents for evaluation of sore throat and malaise. Symptoms have been present for the past 3 days. She admits to sore throat as well as increased size of lymph nodes in her neck. Subjective fevers, no chills. Denies sick contacts. No nausea, vomiting, diarrhea, or constipation. She's had decreased by mouth intake secondary to neck pain. Denies rhinorrhea or congestion. No coughing or shortness of breath. No abdominal pain. She has attempted over-the-counter ibuprofen which provided minimal relief. She has not attempted any other pain relievers.   Review of Systems  Constitutional: Negative for fever and fatigue.  HENT: Negative for nosebleeds, congestion, rhinorrhea, sneezing, neck pain, neck stiffness and postnasal drip.   Eyes: Negative for pain, discharge and itching.  Respiratory: Negative for cough and wheezing.   Cardiovascular: Negative for chest pain.  Gastrointestinal: Negative for nausea, diarrhea and abdominal distention.  Genitourinary: Negative for dysuria.  Skin: Negative for rash.       Objective:   Physical Exam Vitals: Reviewed and unremarkable except for low-grade fever HEENT: Pupils equal round reactive to light, extra ocular movements intact, no scleral icterus, no rhinorrhea present, septum midline, dry mucous membranes, pharyngeal erythema present, bilateral tonsils enlarged and present, concretions present, diffuse anterior lymphadenopathy present in the cervical area, no thyromegaly noted Cardiac: Regular rate and rhythm, S1 and S2 present, no murmurs Respiratory: Clear to auscultation bilaterally, normal effort Abdomen: Soft, nontender, positive bowel sounds in all 4 quadrants Skin: No rash       Assessment & Plan:

## 2013-06-26 NOTE — Patient Instructions (Signed)
Sore Throat A sore throat is pain, burning, irritation, or scratchiness of the throat. There is often pain or tenderness when swallowing or talking. A sore throat may be accompanied by other symptoms, such as coughing, sneezing, fever, and swollen neck glands. A sore throat is often the first sign of another sickness, such as a cold, flu, strep throat, or mononucleosis (commonly known as mono). Most sore throats go away without medical treatment. CAUSES  The most common causes of a sore throat include:  A viral infection, such as a cold, flu, or mono.  A bacterial infection, such as strep throat, tonsillitis, or whooping cough.  Seasonal allergies.  Dryness in the air.  Irritants, such as smoke or pollution.  Gastroesophageal reflux disease (GERD). HOME CARE INSTRUCTIONS   Only take over-the-counter medicines as directed by your caregiver.  Drink enough fluids to keep your urine clear or pale yellow.  Rest as needed.  Try using throat sprays, lozenges, or sucking on hard candy to ease any pain (if older than 4 years or as directed).  Sip warm liquids, such as broth, herbal tea, or warm water with honey to relieve pain temporarily. You may also eat or drink cold or frozen liquids such as frozen ice pops.  Gargle with salt water (mix 1 tsp salt with 8 oz of water).  Do not smoke and avoid secondhand smoke.  Put a cool-mist humidifier in your bedroom at night to moisten the air. You can also turn on a hot shower and sit in the bathroom with the door closed for 5 10 minutes. SEEK IMMEDIATE MEDICAL CARE IF:  You have difficulty breathing.  You are unable to swallow fluids, soft foods, or your saliva.  You have increased swelling in the throat.  Your sore throat does not get better in 7 days.  You have nausea and vomiting.  You have a fever or persistent symptoms for more than 2 3 days.  You have a fever and your symptoms suddenly get worse. MAKE SURE YOU:   Understand  these instructions.  Will watch your condition.  Will get help right away if you are not doing well or get worse. Document Released: 12/16/2004 Document Revised: 10/25/2012 Document Reviewed: 07/16/2012 [facility] Patient Information 2014 [place], [place].  Follow up as needed. I will call you with the strep throat results.

## 2013-06-26 NOTE — Assessment & Plan Note (Signed)
Patient presents with sore throat and anterior cervical lymphadenopathy, rapid strep did not show any evidence of acute streptococcal infection, group A strep probe obtained and sent for confirmatory testing. Suspect viral illness as the cause of her sore throat and lymphadenopathy at this time. -Will pursue conservative measures with ibuprofen and Tylenol his fever pain, salt water gargles, warm tea, patient instructions provided

## 2013-06-28 ENCOUNTER — Telehealth: Payer: Self-pay | Admitting: Family Medicine

## 2013-06-28 NOTE — Telephone Encounter (Signed)
Left message to let patient know that Group A step culture was negative.

## 2013-07-17 ENCOUNTER — Ambulatory Visit: Payer: Medicaid Other

## 2013-07-18 ENCOUNTER — Telehealth: Payer: Self-pay | Admitting: Family Medicine

## 2013-07-18 ENCOUNTER — Ambulatory Visit (INDEPENDENT_AMBULATORY_CARE_PROVIDER_SITE_OTHER): Payer: Medicaid Other | Admitting: Family Medicine

## 2013-07-18 ENCOUNTER — Other Ambulatory Visit (HOSPITAL_COMMUNITY)
Admission: RE | Admit: 2013-07-18 | Discharge: 2013-07-18 | Disposition: A | Discharge status: Home / Self Care | Payer: Medicaid Other | Source: Ambulatory Visit | Attending: Family Medicine | Admitting: Family Medicine

## 2013-07-18 ENCOUNTER — Encounter: Payer: Self-pay | Admitting: Family Medicine

## 2013-07-18 VITALS — BP 121/82 | HR 84 | Ht 62.0 in | Wt 131.0 lb

## 2013-07-18 DIAGNOSIS — N898 Other specified noninflammatory disorders of vagina: Secondary | ICD-10-CM | POA: Insufficient documentation

## 2013-07-18 DIAGNOSIS — R300 Dysuria: Secondary | ICD-10-CM

## 2013-07-18 DIAGNOSIS — Z113 Encounter for screening for infections with a predominantly sexual mode of transmission: Secondary | ICD-10-CM | POA: Insufficient documentation

## 2013-07-18 LAB — POCT WET PREP (WET MOUNT)

## 2013-07-18 MED ORDER — FLUCONAZOLE 150 MG PO TABS: 150.0000 mg | ORAL_TABLET | Freq: Once | ORAL | Status: DC

## 2013-07-18 MED ORDER — METRONIDAZOLE 500 MG PO TABS: 500.0000 mg | ORAL_TABLET | Freq: Two times a day (BID) | ORAL | Status: AC

## 2013-07-18 NOTE — Telephone Encounter (Signed)
Opened in error

## 2013-07-18 NOTE — Progress Notes (Signed)
Patient ID: Sierra Strickland, female   DOB: 1980/12/31, 32 y.o.   MRN: [id number] Subjective:   CC: Vaginal discharge and itching  HPI:   1. Vaginal discharge and itching - Pt reports 2 days of itching, mild smelly thin discharge. Denies fevers, chills, abdominal pain, or dyspareunia. Reports h/o BV and yeast and that condoms tend to irritate her. Took off condom with most recent intercourse due to irritation. Had HIV/RPR tested in May and does not want this tested today. Denies other concerns today.  Review of Systems - Per HPI.   PMH: H/o BV and yeast infection, not recently    Objective:  Physical Exam BP 121/82  Pulse 84  Ht 5\' 2"  (1.575 m)  Wt 131 lb (59.421 kg)  BMI 23.95 kg/m2  LMP 06/24/2013 GEN: NAD GU: External vagina appears WNL, vaginal walls and cervix appear WNL. Mild thin white discharge, no cervical motion tenderness, no lesions or blood seen.    Assessment:     Sierra Strickland is a 32 y.o. female here for vaginal itching and odorous discharge    Plan:     # See problem list for problem-specific plans.

## 2013-07-18 NOTE — Patient Instructions (Addendum)
I will call you with results. 

## 2013-07-18 NOTE — Assessment & Plan Note (Signed)
Denies fever or abdominal pain. Wet prep positive for yeast and BV. Declines HIV/RPR/UPT today. - Diflucan and metronidazole sent to pt's pharmacy and message left on pt's private line where she said okay to leave detailed message [phone]). - GC/Chlamydia pending

## 2013-07-24 ENCOUNTER — Telehealth: Payer: Self-pay | Admitting: Family Medicine

## 2013-07-24 NOTE — Telephone Encounter (Signed)
Called patient to let her know of negative GC/Chlamydia.  She mentioned that flagyl tablets were difficult to swallow. I let her know she can crush and take them that way.   Patient verbalized understanding.  Sierra Singleton, MD

## 2013-10-26 ENCOUNTER — Ambulatory Visit (INDEPENDENT_AMBULATORY_CARE_PROVIDER_SITE_OTHER): Payer: Medicaid Other | Admitting: Family Medicine

## 2013-10-26 ENCOUNTER — Other Ambulatory Visit (HOSPITAL_COMMUNITY)
Admission: RE | Admit: 2013-10-26 | Discharge: 2013-10-26 | Disposition: A | Discharge status: Home / Self Care | Payer: Medicaid Other | Source: Ambulatory Visit | Attending: Family Medicine | Admitting: Family Medicine

## 2013-10-26 VITALS — BP 109/77 | HR 120 | Temp 98.0°F | Wt 132.0 lb

## 2013-10-26 DIAGNOSIS — N898 Other specified noninflammatory disorders of vagina: Secondary | ICD-10-CM

## 2013-10-26 DIAGNOSIS — Z113 Encounter for screening for infections with a predominantly sexual mode of transmission: Secondary | ICD-10-CM | POA: Insufficient documentation

## 2013-10-26 LAB — POCT WET PREP (WET MOUNT)

## 2013-10-26 LAB — POCT URINALYSIS DIPSTICK
Bilirubin, UA: NEGATIVE
Glucose, UA: NEGATIVE
Ketones, UA: 15
Leukocytes, UA: NEGATIVE
Spec Grav, UA: >=1.03

## 2013-10-26 MED ORDER — METRONIDAZOLE 500 MG PO TABS: 500.0000 mg | ORAL_TABLET | Freq: Two times a day (BID) | ORAL | Status: DC

## 2013-10-26 NOTE — Assessment & Plan Note (Addendum)
Wet prep + for BV - Pt declined Flagyl "difficult to swallow pills" - Doesn't want other treatment at this time,"just wanted to know that she didn't have anything else" - Will call with GC/Cx results

## 2013-10-26 NOTE — Patient Instructions (Signed)
It was great seeing you today.   1. I will call you with your test results.    Please bring all your medications to every doctors visit  Sign up for My Chart to have easy access to your labs results, and communication with your Primary care physician.   Please check-out at the front desk before leaving the clinic.   I look forward to talking with you again at our next visit. If you have any questions or concerns before then, please call the clinic at [phone].  Take Care,   Dr Wenda Low

## 2013-10-26 NOTE — Progress Notes (Signed)
Subjective:     Patient ID: Sierra Strickland, female   DOB: 1981/03/21, 32 y.o.   MRN: [id number]  HPI Comments: Has not been using condoms with single sexual partner. Denies new bath soaps or laundry detergents. No douches, No itching, No pain with sex or urination. Report mild bilateral lower abdominal pain. Has had tubal ligation. LMP Nov 5th, but report two periods in Oct.   Vaginal Discharge The patient's primary symptoms include a vaginal discharge. The patient's pertinent negatives include no genital itching, genital lesions, genital odor, pelvic pain or vaginal bleeding. This is a new problem. The current episode started in the past 7 days. The problem occurs daily. The problem has been unchanged. The pain is mild. The problem affects both sides. She is not pregnant. Associated symptoms include abdominal pain. Pertinent negatives include no back pain, chills, constipation, diarrhea, dysuria, fever, flank pain, frequency, hematuria, rash, urgency or vomiting. The vaginal discharge was milky, watery and white. There has been no bleeding. She has not been passing clots. She has not been passing tissue. Nothing aggravates the symptoms. She has tried nothing for the symptoms. She is sexually active. It is unknown whether or not her partner has an STD. She uses tubal ligation for contraception. Her menstrual history has been irregular. Her past medical history is significant for a Cesarean section, an STD and vaginosis. There is no history of an abdominal surgery, an ectopic pregnancy or PID.  Abdominal Pain Pertinent negatives include no constipation, diarrhea, dysuria, fever, frequency, hematuria or vomiting. There is no history of abdominal surgery.     Review of Systems  Constitutional: Negative for fever and chills.  Gastrointestinal: Positive for abdominal pain. Negative for vomiting, diarrhea and constipation.  Genitourinary: Positive for vaginal discharge. Negative for dysuria, urgency,  frequency, hematuria, flank pain, vaginal bleeding, vaginal pain, pelvic pain and dyspareunia.  Musculoskeletal: Negative for back pain.  Skin: Negative for rash.       Objective:   Physical Exam  Constitutional: She appears well-developed and well-nourished.  Cardiovascular: Normal rate and regular rhythm.   Pulmonary/Chest: Effort normal and breath sounds normal.  Abdominal: Soft. Bowel sounds are normal. She exhibits no distension and no mass. There is no tenderness. There is no rebound and no guarding.  Genitourinary: Uterus normal. Vaginal discharge found.  Speculum Exam: Ext genitalia: wnl; Vaginal discharge: thin white; Cervix: no purulent discharge Bimanual Exam: No Cervical motion tenderness; No Vaginal wall defects; Adnexa nontender     Assessment/Plan:      See Problem Focused Assessment & Plan

## 2014-03-05 ENCOUNTER — Ambulatory Visit (INDEPENDENT_AMBULATORY_CARE_PROVIDER_SITE_OTHER): Payer: Medicaid Other | Admitting: Family Medicine

## 2014-03-05 ENCOUNTER — Encounter: Payer: Self-pay | Admitting: Family Medicine

## 2014-03-05 ENCOUNTER — Other Ambulatory Visit (HOSPITAL_COMMUNITY)
Admission: RE | Admit: 2014-03-05 | Discharge: 2014-03-05 | Disposition: A | Discharge status: Home / Self Care | Payer: Medicaid Other | Source: Ambulatory Visit | Attending: Family Medicine | Admitting: Family Medicine

## 2014-03-05 VITALS — BP 113/83 | HR 75 | Temp 98.4°F | Ht 62.0 in | Wt 138.0 lb

## 2014-03-05 DIAGNOSIS — M545 Low back pain, unspecified: Secondary | ICD-10-CM | POA: Insufficient documentation

## 2014-03-05 DIAGNOSIS — N760 Acute vaginitis: Secondary | ICD-10-CM

## 2014-03-05 DIAGNOSIS — M5380 Other specified dorsopathies, site unspecified: Secondary | ICD-10-CM

## 2014-03-05 DIAGNOSIS — M62830 Muscle spasm of back: Secondary | ICD-10-CM

## 2014-03-05 DIAGNOSIS — Z113 Encounter for screening for infections with a predominantly sexual mode of transmission: Secondary | ICD-10-CM | POA: Insufficient documentation

## 2014-03-05 DIAGNOSIS — N898 Other specified noninflammatory disorders of vagina: Secondary | ICD-10-CM

## 2014-03-05 LAB — POCT WET PREP (WET MOUNT): CLUE CELLS WET PREP WHIFF POC: POSITIVE

## 2014-03-05 MED ORDER — CYCLOBENZAPRINE HCL 5 MG PO TABS: 5.0000 mg | ORAL_TABLET | Freq: Three times a day (TID) | ORAL | Status: DC | PRN

## 2014-03-05 NOTE — Assessment & Plan Note (Signed)
Exam reassuring, no evidence of radiculopathy.  P: continue stretches (demonstrated additional ham string stretches), ibuprofen/aleve/tylenol for pain. Flexeril prescribed in the event of another severe back spasm. Also discussed possibility of using a foam roller for stretching back.

## 2014-03-05 NOTE — Patient Instructions (Addendum)
  Back Pain: continue doing the stretches, these are one of the best things to help low back pain. In addition to your back stretches, I would also stretch your hamstrings; these strong muscles in your legs attach to your back and can pull on your spine to cause pain. Another potentially helpful thing to do is to get a foam back roller, this will allow you to roll your back to help stretch and "pop" any joints that you normally have your children do when they step on your back. If you have a muscle spasm, I've given you a short prescription for a medication called Flexeril that is a muscle relaxer. Take 1 tablet (5mg ) as needed, up to 3 times a day (every 8 hours). If the single tablet does not help, you can try taking 2 tablets.

## 2014-03-05 NOTE — Assessment & Plan Note (Signed)
Wet prep with evidence for BV again, will call patient and discuss if she would like to take flagyl.

## 2014-03-05 NOTE — Progress Notes (Signed)
Patient ID: Sierra Strickland, female   DOB: 11-19-1981, 33 y.o.   MRN: [name]   Subjective:    Patient ID: Sierra ConradiWendy L Strickland, female    DOB: 11-19-1981, 33 y.o.   MRN: [id number]  HPI  CC: vaginal discharge, back pain  # Vaginal discharge:  Says it is only "a little"  Started in the past few days  Has had multiple prior diagnoses of BV, last of which she declined flagyl treatment  Denies bleeding, itchiness  # Low back pain  Present since had her first child  Low back, but also mid and in neck  Takes aspirin 200mg   Had a severe spasm on Saturday that would not let her move her back (first time this has happened)  Does do some basic stretches she was shown at previous clinic visits ROS: no weakness or numbness in legs, loss of bowel/bladder control,   Review of Systems   See HPI for ROS. Objective:  BP 113/83  Pulse 75  Temp(Src) 98.4 F (36.9 C) (Oral)  Ht 5\' 2"  (1.575 m)  Wt 138 lb (62.596 kg)  BMI 25.23 kg/m2  LMP 02/21/2014  General: NAD GU: External genitalia normal. Speculum: NAVM, small amount of clear/white discharge, no visible drainage from cervical os. Bimanual: normal sized ovaries/uterus, no CMT. MSK: Back: no spinal tenderness to palpation or percussion. No deformity of spine noted. Full ROM, though extremes of range did cause discomfort Neuro: strength 5/5 bilaterally for leg extension/flexion, hip flexion/extension, ankle flexion/extension    Assessment & Plan:  See Problem List Documentation

## 2014-03-06 ENCOUNTER — Telehealth: Payer: Self-pay | Admitting: Family Medicine

## 2014-03-06 NOTE — Telephone Encounter (Signed)
Called patient and informed of wet prep results (positve for BV). At this time patient is declining treatment with flagyl. I discussed with her that she can call the clinic if she changes her mind and she may not necessarily need to be seen for a visit if she calls in the next ~1 month, otherwise a provider may request she be seen before prescribing antibiotics. -Dr Waynetta SandyWight

## 2014-03-07 LAB — CERVICOVAGINAL ANCILLARY ONLY
Chlamydia: NEGATIVE
Neisseria Gonorrhea: NEGATIVE

## 2014-03-08 ENCOUNTER — Encounter: Payer: Self-pay | Admitting: Family Medicine

## 2014-06-13 ENCOUNTER — Ambulatory Visit (INDEPENDENT_AMBULATORY_CARE_PROVIDER_SITE_OTHER): Payer: Medicaid Other | Admitting: Family Medicine

## 2014-06-13 ENCOUNTER — Telehealth: Payer: Self-pay | Admitting: Family Medicine

## 2014-06-13 ENCOUNTER — Other Ambulatory Visit (HOSPITAL_COMMUNITY)
Admission: RE | Admit: 2014-06-13 | Discharge: 2014-06-13 | Disposition: A | Discharge status: Home / Self Care | Payer: Medicaid Other | Source: Ambulatory Visit | Attending: Family Medicine | Admitting: Family Medicine

## 2014-06-13 VITALS — BP 116/60 | HR 72 | Temp 99.3°F | Wt 144.0 lb

## 2014-06-13 DIAGNOSIS — Z01419 Encounter for gynecological examination (general) (routine) without abnormal findings: Secondary | ICD-10-CM | POA: Insufficient documentation

## 2014-06-13 DIAGNOSIS — Z113 Encounter for screening for infections with a predominantly sexual mode of transmission: Secondary | ICD-10-CM | POA: Insufficient documentation

## 2014-06-13 DIAGNOSIS — N898 Other specified noninflammatory disorders of vagina: Secondary | ICD-10-CM

## 2014-06-13 DIAGNOSIS — Z202 Contact with and (suspected) exposure to infections with a predominantly sexual mode of transmission: Secondary | ICD-10-CM | POA: Insufficient documentation

## 2014-06-13 DIAGNOSIS — M549 Dorsalgia, unspecified: Secondary | ICD-10-CM

## 2014-06-13 DIAGNOSIS — Z1151 Encounter for screening for human papillomavirus (HPV): Secondary | ICD-10-CM | POA: Insufficient documentation

## 2014-06-13 DIAGNOSIS — M62830 Muscle spasm of back: Secondary | ICD-10-CM

## 2014-06-13 DIAGNOSIS — Z124 Encounter for screening for malignant neoplasm of cervix: Secondary | ICD-10-CM

## 2014-06-13 DIAGNOSIS — M5380 Other specified dorsopathies, site unspecified: Secondary | ICD-10-CM

## 2014-06-13 DIAGNOSIS — Z20828 Contact with and (suspected) exposure to other viral communicable diseases: Secondary | ICD-10-CM

## 2014-06-13 LAB — POCT WET PREP (WET MOUNT): Clue Cells Wet Prep Whiff POC: POSITIVE

## 2014-06-13 MED ORDER — CYCLOBENZAPRINE HCL 5 MG PO TABS: 5.0000 mg | ORAL_TABLET | Freq: Two times a day (BID) | ORAL | Status: DC | PRN

## 2014-06-13 NOTE — Assessment & Plan Note (Signed)
Mild odor, no pain or burning, present 2 weeks. New sexual partner, but no tenderness, abdominal pain, burning, or other concerns. - Wet prep, GC/Chlamydia, and due for pap smear as well. New soap. - Call with abnormal results. - Change back to old soap. Avoid perfumed products. - Change underwear an extra time in day. - Wear cotton underewar.

## 2014-06-13 NOTE — Assessment & Plan Note (Signed)
Upper back, likely muscle spasm / sprain of paraspinal muscle.  - Continue slow stretching, heat, massage - Ibuprofen 400mg  3-4 times daily prn with food - Flexeril 5mg  daily PRN muscle spasms. Warmed re: drowsiness.

## 2014-06-13 NOTE — Progress Notes (Signed)
Patient ID: Sierra Strickland, female   DOB: 1981/07/14, 33 y.o.   MRN: [id number] Subjective:   CC: Vaginal discharge  HPI:   Vaginal discharge This is a 33 y.o. female who presents with vaginal discharge, present for the past 2 weeks. She states it is white, thick, and has light odor. She denies burning, itching, pain, fever, chills, abdominal pain, dysuria, or dyspareunia. She has had 1 new sexual partner over the last month. She has recently used a new soap but does not douche.   Back spasms Patient reports yesterday stretching and having sudden severe mid-upper back spasms that were not preceded by any injury or heavy lifting. She took advil which helped. She reports today it is "lots better," not as sore. She thinks the cause is her old mattress. Does not report radiation of pain, incontinence, weakness, or perineal numbness.   Review of Systems - Per HPI.   SH: 1 new sexual partner over last month.    Objective:  Physical Exam BP 116/60  Pulse 72  Temp(Src) 99.3 F (37.4 C) (Oral)  Wt 144 lb (65.318 kg)  LMP 05/22/2014 GEN: NAD, pleasant ABD: soft, nontender, nondistended GU: Thin white discharge, no tenderness at vaginal wall, no odor, no cervical motion tenderness, uterus and adnexae normal size and mobility BACK: Tenderness mild along medial right scapula, shoulder ROM mildly limited at abduction and external rotation bilaterally due to pain, no erythema or tenderness    Assessment:     Sierra Strickland is a 33 y.o. female here for vaginal discharge and back pain.    Plan:     Vaginal discharge Mild odor, no pain or burning, present 2 weeks. New sexual partner, but no tenderness, abdominal pain, burning, or other concerns. - Wet prep, GC/Chlamydia, and due for pap smear as well. New soap. - Call with abnormal results. - Change back to old soap. Avoid perfumed products. - Change underwear an extra time in day. - Wear cotton underewar.  Back pain Upper back, likely  muscle spasm / sprain of paraspinal muscle.  - Continue slow stretching, heat, massage - Ibuprofen 400mg  3-4 times daily prn with food - Flexeril 5mg  daily PRN muscle spasms. Warmed re: drowsiness.   # Health Maintenance:  - Return when convenient for full HM visit. Pap and STD screen done today.  Follow-up: Follow up in when convenient for HM visit.   Leona SingletonMaria T Seleni Meller, MD [facility]

## 2014-06-13 NOTE — Patient Instructions (Signed)
We are getting labs today. I will call you if any are not normal. Go back to your old soap, avoid any perfumed products, and wear cotton underwear.  Continue stretching, use heat, continue ibuprofen 400 mg 3-4 times a day as needed with food. Use Flexeril 5 mg only if needed for muscle spasms. Be aware that this may make you drowsy she should not drive.  Return whenever convenient for the remainder of your health maintenance visit.  Sierra Strickland, Sierra Strickland  Muscle Cramps and Spasms Muscle cramps and spasms are when muscles tighten by themselves. They usually get better within minutes. Muscle cramps are painful. They are usually stronger and last longer than muscle spasms. Muscle spasms may or may not be painful. They can last a few seconds or much longer. HOME CARE  Drink enough fluid to keep your pee (urine) clear or pale yellow.  Massage, stretch, and relax the muscle.  Use a warm towel, heating pad, or warm shower water on tight muscles.  Place ice on the muscle if it is tender or in pain.  Put ice in a plastic bag.  Place a towel between your skin and the bag.  Leave the ice on for 15-20 minutes, 03-04 times a day.  Only take medicine as told by your doctor. GET HELP RIGHT AWAY IF:  Your cramps or spasms get worse, happen more often, or do not get better with time. MAKE SURE YOU:  Understand these instructions.  Will watch your condition.  Will get help right away if you are not doing well or get worse. Document Released: 10/21/2008 Document Revised: 03/05/2013 Document Reviewed: 10/25/2012 [facility] Patient Information 2015 [place], [place]. This information is not intended to replace advice given to you by your health care provider. Make sure you discuss any questions you have with your health care provider.

## 2014-06-14 MED ORDER — METRONIDAZOLE 0.75 % VA GEL: 1.0000 | Freq: Two times a day (BID) | VAGINAL | Status: DC

## 2014-06-14 NOTE — Telephone Encounter (Signed)
Discussed result of positive wet prep with patient. She has BV but no trichomonas or yeast. She would like treatment and prefers metrogel to PO metronidazole. Will send in to [facility] [address].    Sierra SingletonMaria T Catherine Cubero, MD

## 2014-06-14 NOTE — Addendum Note (Signed)
Addended by: Simone CuriaHEKKEKANDAM, Syeda Prickett T on: 06/14/2014 04:54 PM   Modules accepted: Orders, Medications

## 2014-06-17 LAB — CYTOLOGY - PAP

## 2014-06-24 ENCOUNTER — Telehealth: Payer: Self-pay | Admitting: Family Medicine

## 2014-06-24 ENCOUNTER — Encounter: Payer: Self-pay | Admitting: Family Medicine

## 2014-06-24 NOTE — Telephone Encounter (Signed)
"  phone number you are trying to reach is not accepting calls at this time".  Will mail letter. Voyd Groft, Maryjo RochesterJessica Dawn

## 2014-06-24 NOTE — Telephone Encounter (Signed)
Letter is already written, but that is roughly what I said. Sierra Strickland, Sierra RochesterJessica Dawn

## 2014-06-24 NOTE — Telephone Encounter (Signed)
Please call to let [name] know that she did not get blood drawn at last visit to test HIV and syphilis. Those orders are in so she can make lab-only appointment to get this done.  Sierra SingletonMaria T Alejandra Barna, MD

## 2014-06-24 NOTE — Telephone Encounter (Signed)
That sounds good - if not already written, please just state that we had ordered bloodwork that she did not get collected, or just that she needs to call us. Thank you! Sierra SingletonMaria T Averill Winters, MD

## 2015-03-18 ENCOUNTER — Encounter: Payer: Self-pay | Admitting: Family Medicine

## 2015-03-18 ENCOUNTER — Ambulatory Visit (INDEPENDENT_AMBULATORY_CARE_PROVIDER_SITE_OTHER): Payer: Self-pay | Admitting: Family Medicine

## 2015-03-18 ENCOUNTER — Other Ambulatory Visit (HOSPITAL_COMMUNITY)
Admission: RE | Admit: 2015-03-18 | Discharge: 2015-03-18 | Disposition: A | Discharge status: Home / Self Care | Payer: Medicaid Other | Source: Ambulatory Visit | Attending: Family Medicine | Admitting: Family Medicine

## 2015-03-18 VITALS — BP 128/80 | HR 85 | Temp 98.5°F | Ht 62.0 in | Wt 154.0 lb

## 2015-03-18 DIAGNOSIS — Z202 Contact with and (suspected) exposure to infections with a predominantly sexual mode of transmission: Secondary | ICD-10-CM

## 2015-03-18 DIAGNOSIS — Z113 Encounter for screening for infections with a predominantly sexual mode of transmission: Secondary | ICD-10-CM | POA: Insufficient documentation

## 2015-03-18 DIAGNOSIS — N898 Other specified noninflammatory disorders of vagina: Secondary | ICD-10-CM

## 2015-03-18 DIAGNOSIS — Z20828 Contact with and (suspected) exposure to other viral communicable diseases: Secondary | ICD-10-CM

## 2015-03-18 LAB — POCT WET PREP (WET MOUNT): Clue Cells Wet Prep Whiff POC: POSITIVE

## 2015-03-18 MED ORDER — FLUCONAZOLE 150 MG PO TABS: 150.0000 mg | ORAL_TABLET | Freq: Once | ORAL | Status: DC

## 2015-03-18 NOTE — Progress Notes (Signed)
Patient ID: Sierra Strickland, female   DOB: [birth year], 34 y.o.   MRN: [id number]    Subjective:   CC: STD testing  HPI:   Patient presents to sameday clinic for STD testing. Partner recently diagnosed with HSV 1 and 2. She denies any current symptoms. They do not use protection. He had rash just above shaft of penis about 2 weeks ago. She has not had fevers, chills, pain, rash, pelvic pain, vaginal discharge, or other concern. She has only been with this partner for 8 months.  Review of Systems - Per HPI.   PMH - tobacco abuse, low back pain Smoking status: Current every day smoker    Objective:  Physical Exam BP 128/80 mmHg  Pulse 85  Temp(Src) 98.5 F (36.9 C) (Oral)  Ht 5\' 2"  (1.575 m)  Wt 154 lb (69.854 kg)  BMI 28.16 kg/m2  LMP 02/17/2015 (Approximate) GEN: NAD GU: Normal appearing external genitalia with no apparent lesions on exam, no discharge, cervix normal appearing, no CMT; minimal whitish discharge present ABD: S/NT/ND   Assessment:     Sierra Strickland is a 34 y.o. female here for STD testing.    Plan:     # See problem list and after visit summary for problem-specific plans.   # Health Maintenance: STD testing per above.  Follow-up: Follow up PRN if any symptoms develop.   Leona SingletonMaria T Piotr Christopher, MD [facility]

## 2015-03-18 NOTE — Assessment & Plan Note (Addendum)
Patient concerned about exposure to partner who was diagnosed with HSV 1 & 2. She has no symptoms. Lots of questions about transmission and prognosis. - Discussed that HSV can be transmitted by skin-to-skin contact when rash present or absent, and that partner's treatment can reduce infectivity as can using protection but cannot guarantee no transmission. Provided printed information as well. - STD testing: HSV 1 & 2 IgG (discussed difficulty of interpreting results/relating to timing of infection), HIV, RPR, GC/chlamydia, wet prep. - Diflucan for wet prep positive for yeast. Also with BV but pt asx so no flagyl at this time per discussion. - Encouraged using condoms with every encounter to reduce transmission of STDs.

## 2015-03-18 NOTE — Patient Instructions (Signed)
Herpes Simplex Herpes simplex is generally classified as Type 1 or Type 2. Type 1 is generally the type that is responsible for cold sores. Type 2 is generally associated with sexually transmitted diseases. We now know that most of the thoughts on these viruses are inaccurate. We find that HSV1 is also present genitally and HSV2 can be present orally, but this will vary in different locations of the world. Herpes simplex is usually detected by doing a culture. Blood tests are also available for this virus; however, the accuracy is often not as good.  PREPARATION FOR TEST No preparation or fasting is necessary. NORMAL FINDINGS  No virus present  No HSV antigens or antibodies present Ranges for normal findings may vary among different laboratories and hospitals. You should always check with your doctor after having lab work or other tests done to discuss the meaning of your test results and whether your values are considered within normal limits. MEANING OF TEST  Your caregiver will go over the test results with you and discuss the importance and meaning of your results, as well as treatment options and the need for additional tests if necessary. OBTAINING THE TEST RESULTS  It is your responsibility to obtain your test results. Ask the lab or department performing the test when and how you will get your results. Document Released: 12/11/2004 Document Revised: 01/31/2012 Document Reviewed: 10/19/2008 [facility] Patient Information 2015 [place], [place]. This information is not intended to replace advice given to you by your health care provider. Make sure you discuss any questions you have with your health care provider.  I will call you with lab results.  Best,  Leona SingletonMaria T Maicey Barrientez, MD

## 2015-03-19 ENCOUNTER — Telehealth: Payer: Self-pay | Admitting: Family Medicine

## 2015-03-19 LAB — HIV ANTIBODY (ROUTINE TESTING W REFLEX): HIV 1&2 Ab, 4th Generation: NONREACTIVE

## 2015-03-19 LAB — HSV 1 ANTIBODY, IGG: HSV 1 Glycoprotein G Ab, IgG: <0.1 IV

## 2015-03-19 LAB — CERVICOVAGINAL ANCILLARY ONLY
Chlamydia: NEGATIVE
Neisseria Gonorrhea: NEGATIVE

## 2015-03-19 LAB — HSV 2 ANTIBODY, IGG: HSV 2 Glycoprotein G Ab, IgG: 6.08 IV — ABNORMAL HIGH

## 2015-03-19 LAB — RPR

## 2015-03-19 NOTE — Telephone Encounter (Addendum)
Called patient to review lab results. 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 Again discussed that we cannot know exactly when infection may have occurred except that the antibody we tested signified it was not an acute infection. She also has no blistering/skin changes at this time.  - Discussed continuing to use protection and keeping an eye out for flareups.  - If she does have a flareup with blistering/pain, discussed immediate antiviral would be indicated so to come back if this were to occur so we could evaluate. 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  Also, HIV, RPR, GC and chlamydia testing all negative.  She voiced understanding and appreciation for call.   Sierra SingletonMaria T Tailey Top, MD

## 2015-03-20 ENCOUNTER — Encounter: Payer: Self-pay | Admitting: Family Medicine

## 2015-03-20 ENCOUNTER — Telehealth: Payer: Self-pay | Admitting: Family Medicine

## 2015-03-20 DIAGNOSIS — B009 Herpesviral infection, unspecified: Secondary | ICD-10-CM | POA: Insufficient documentation

## 2015-03-20 NOTE — Telephone Encounter (Signed)
Error

## 2015-06-04 ENCOUNTER — Ambulatory Visit: Payer: Medicaid Other | Admitting: Family Medicine

## 2015-09-02 ENCOUNTER — Encounter: Payer: Self-pay | Admitting: Family Medicine

## 2015-09-02 ENCOUNTER — Ambulatory Visit (INDEPENDENT_AMBULATORY_CARE_PROVIDER_SITE_OTHER): Payer: Self-pay | Admitting: Family Medicine

## 2015-09-02 VITALS — BP 122/79 | HR 83 | Temp 98.8°F | Ht 62.0 in | Wt 161.3 lb

## 2015-09-02 DIAGNOSIS — H1011 Acute atopic conjunctivitis, right eye: Secondary | ICD-10-CM

## 2015-09-02 DIAGNOSIS — H1010 Acute atopic conjunctivitis, unspecified eye: Secondary | ICD-10-CM | POA: Insufficient documentation

## 2015-09-02 MED ORDER — OLOPATADINE HCL 0.1 % OP SOLN: 1.0000 [drp] | Freq: Two times a day (BID) | OPHTHALMIC | Status: DC

## 2015-09-02 NOTE — Progress Notes (Signed)
Subjective:     Patient ID: Sierra Strickland, female   DOB: [birth year], 34 y.o.   MRN: [id number]  Conjunctivitis  The current episode started 5 to 7 days ago (C/O watering of her right eye for 3 days, denies any pikish discoloration or maybe slight). The problem occurs frequently. The problem has been gradually improving. The problem is mild. The symptoms are relieved by one or more OTC medications (Visine-A OTC). Nothing aggravates the symptoms. Associated symptoms include eye discharge. Pertinent negatives include no fever, no decreased vision, no double vision, no eye itching, no photophobia, no ear pain, no headaches, no rhinorrhea, no sore throat, no cough, no eye pain and no eye redness. Associated symptoms comments: Very few discharge whenever she wakes up in the morning.. There were no sick contacts.   Current Outpatient Prescriptions on File Prior to Visit  Medication Sig Dispense Refill  . cyclobenzaprine (FLEXERIL) 5 MG tablet Take 1 tablet (5 mg total) by mouth 2 (two) times daily as needed for muscle spasms. 5 tablet 0  . fluconazole (DIFLUCAN) 150 MG tablet Take 1 tablet (150 mg total) by mouth once. 1 tablet 0  . metroNIDAZOLE (FLAGYL) 500 MG tablet Take 1 tablet (500 mg total) by mouth 2 (two) times daily. 14 tablet 0  . metroNIDAZOLE (METROGEL VAGINAL) 0.75 % vaginal gel Place 1 Applicatorful vaginally 2 (two) times daily. For 5 days. 70 g 0   No current facility-administered medications on file prior to visit.   Past Medical History  Diagnosis Date  . BELLS PALSY 04/08/2009    Qualifier: Diagnosis of  By: Wallene Huh  MD, Rande Lawman       Review of Systems  Constitutional: Negative for fever.  HENT: Negative for ear pain, rhinorrhea and sore throat.   Eyes: Positive for discharge. Negative for double vision, photophobia, pain, redness and itching.  Respiratory: Negative for cough.   Neurological: Negative for headaches.  All other systems reviewed and are negative.  Filed Vitals:   09/02/15 0910  BP: 122/79  Pulse: 83  Temp: 98.8 F (37.1 C)  TempSrc: Oral  Height:  (1.575 m)  Weight: 161 lb 5 oz (73.171 kg)  SpO2: 100%       Objective:   Physical Exam  Constitutional: She appears well-developed. No distress.  HENT:  Right Ear: Tympanic membrane and ear canal normal.  Left Ear: Tympanic membrane and ear canal normal.  Mouth/Throat: Oropharynx is clear and moist and mucous membranes are normal.  Eyes: Conjunctivae and EOM are normal. Right eye exhibits no discharge. Left eye exhibits no discharge. Right conjunctiva is not injected. Left conjunctiva is not injected. Right pupil is round and reactive. Left pupil is round and reactive. Pupils are equal.    Cardiovascular: Normal rate, regular rhythm, normal heart sounds and intact distal pulses.   No murmur heard. Pulmonary/Chest: Effort normal and breath sounds normal. No respiratory distress. She has no wheezes.  Nursing note and vitals reviewed.      Assessment:     Allergic conjunctivitis     Plan:     Check problem list.

## 2015-09-02 NOTE — Patient Instructions (Signed)
Allergic Conjunctivitis A thin, clear membrane (conjunctiva) covers the white part of your eye and the inner surface of your eyelid. Allergic conjunctivitis happens when this membrane gets irritated. This is caused by allergies. Common things (allergens) that can cause an allergic reaction include:  Dust.  Pollen.  Mold.  Animal:  Hair.  Fur.  Skin.  Saliva or other animal fluids. This condition can make your eye red or pink. It can also make your eye feel itchy. This condition cannot be spread by one person to another person (noncontagious).  HOME CARE  Take or apply medicines only as told by your doctor.  Avoid touching or rubbing your eyes.  Apply a cool, clean washcloth to your eye for 10-20 minutes. Do this 3-4 times a day.  If you wear contact lenses, do not wear them until the irritation is gone. Wear glasses in the meantime.  Avoid wearing eye makeup until the irritation is gone.  Try to avoid whatever allergen is causing the allergic reaction. GET HELP IF:  Your symptoms get worse.  You have pus draining from your eyes.  You have new symptoms.  You have a fever.   This information is not intended to replace advice given to you by your health care provider. Make sure you discuss any questions you have with your health care provider.   Document Released: 04/28/2010 Document Revised: 11/29/2014 Document Reviewed: 08/20/2014 Elsevier Interactive Patient Education 2016 Elsevier Inc.  

## 2015-09-02 NOTE — Assessment & Plan Note (Signed)
Mostly on the right but sometime on the left. No signs of infection. Olopatadine prescribed. Return precaution discussed.

## 2015-11-07 ENCOUNTER — Ambulatory Visit (INDEPENDENT_AMBULATORY_CARE_PROVIDER_SITE_OTHER): Payer: Medicaid Other | Admitting: Family Medicine

## 2015-11-07 ENCOUNTER — Other Ambulatory Visit (HOSPITAL_COMMUNITY)
Admission: RE | Admit: 2015-11-07 | Discharge: 2015-11-07 | Disposition: A | Discharge status: Home / Self Care | Payer: Medicaid Other | Source: Ambulatory Visit | Attending: Family Medicine | Admitting: Family Medicine

## 2015-11-07 VITALS — BP 113/77 | HR 75 | Temp 98.3°F | Ht 62.0 in | Wt 157.8 lb

## 2015-11-07 DIAGNOSIS — Z20828 Contact with and (suspected) exposure to other viral communicable diseases: Secondary | ICD-10-CM | POA: Diagnosis not present

## 2015-11-07 DIAGNOSIS — Z113 Encounter for screening for infections with a predominantly sexual mode of transmission: Secondary | ICD-10-CM

## 2015-11-07 DIAGNOSIS — Z202 Contact with and (suspected) exposure to infections with a predominantly sexual mode of transmission: Secondary | ICD-10-CM

## 2015-11-07 LAB — POCT WET PREP (WET MOUNT): Clue Cells Wet Prep Whiff POC: NEGATIVE

## 2015-11-07 NOTE — Progress Notes (Signed)
   Subjective:    Patient ID: Sierra Strickland, female    DOB: 09-Mar-1981, 34 y.o.   MRN: [id number]  HPI  Patient presents for Same Day Appointment  CC: STD check  # STD check:  Found out boyfriend of 1 year has been cheating on her  She does not use condoms with him (she has BTL)  No symptoms or complaints except for a brief pain on the right side of her abdomen last week that has gone away  No dysuria, no vaginal discharge, no nausea, no fevers, no rashes or cold sores  Social Hx: current smoker  Review of Systems   See HPI for ROS.   Past medical history, surgical, family, and social history reviewed and updated in the EMR as appropriate.  Objective:  BP 113/77 mmHg  Pulse 75  Temp(Src) 98.3 F (36.8 C) (Oral)  Ht 5\' 2"  (1.575 m)  Wt 157 lb 12.8 oz (71.578 kg)  BMI 28.85 kg/m2  LMP 10/21/2015 (Approximate) Vitals and nursing note reviewed  General: NAD Abdomen: soft, mild tenderness RLQ but no rebound or guarding GU: normal external genitalia. Normal appearing vaginal mucosa and cervix. Bimanual exam normal sized ovaries without adnexal tenderness, normal uterus.     Assessment & Plan:   1. Screen for STD (sexually transmitted disease)/Contact with or exposure to venereal disease/Exposure to viral disease Discussed safe sex practices. Will follow up with testing results. - RPR - GC/Chlamydia [facility] amp ([place] - POCT Wet Prep [facility]([place]) - HIV antibody

## 2015-11-08 LAB — HIV ANTIBODY (ROUTINE TESTING W REFLEX): HIV 1&2 Ab, 4th Generation: NONREACTIVE

## 2015-11-08 LAB — RPR

## 2015-11-10 LAB — GC/CHLAMYDIA PROBE AMP (~~LOC~~) NOT AT ARMC
Chlamydia: NEGATIVE
Neisseria Gonorrhea: NEGATIVE

## 2015-11-11 ENCOUNTER — Telehealth: Payer: Self-pay | Admitting: Family Medicine

## 2015-11-11 NOTE — Telephone Encounter (Signed)
Will forward to Dr. Waynetta SandyWight who saw patient for this visit. Vicente Weidler,CMA

## 2015-11-11 NOTE — Telephone Encounter (Signed)
Would like test results from Friday   °

## 2015-11-12 NOTE — Telephone Encounter (Signed)
Will forward to MD who saw patient as well as pcp. Jazmin Hartsell,CMA

## 2015-11-12 NOTE — Telephone Encounter (Signed)
Pt called again about her results  °

## 2015-11-13 NOTE — Telephone Encounter (Signed)
Called and let her know her test results were negative for STDs. Advised safe sex. Follow-up as needed.

## 2015-12-26 ENCOUNTER — Encounter: Payer: Self-pay | Admitting: Family Medicine

## 2015-12-26 ENCOUNTER — Ambulatory Visit (INDEPENDENT_AMBULATORY_CARE_PROVIDER_SITE_OTHER): Payer: Medicaid Other | Admitting: Family Medicine

## 2015-12-26 VITALS — BP 124/80 | HR 71 | Temp 98.4°F | Wt 153.6 lb

## 2015-12-26 DIAGNOSIS — H00024 Hordeolum internum left upper eyelid: Secondary | ICD-10-CM | POA: Diagnosis not present

## 2015-12-26 NOTE — Progress Notes (Signed)
EYE COMPLAINT Started 3 days ago. Wake up and had swelling and tenderness. No discharge. No crusting. Warm compresses helped. Seems to get better as the day goes on, then its worse by morning.  Eye symptoms started 3 days ago. Eye involved: Left Eye symptom progression: day one was the worst, but hasn't improved much Other people with same problem: no Medications tried:  Tylenol, warm compresses, eye drops (visine) Eye Trauma: No Contact Lens: No Recent eye surgeries: no  Symptoms Itching: No, only soreness of upper lid Eye discharge or mattering: no Vision impairment: no Photophobia: no Nose discharge: no Sneezing: no Vomiting: no Rings around lights:  no  ROS see HPI Smoking Status noted  Objective: BP 124/80 mmHg  Pulse 71  Temp(Src) 98.4 F (36.9 C) (Oral)  Wt 153 lb 9.6 oz (69.673 kg)  LMP 12/21/2015 (Exact Date) Gen: NAD, alert, cooperative, and pleasant. HEENT: NCAT, TMs clear, EOMI (without pain), PERRL, sight intact, sclera clear, no discharge present, left-sided upper lid with edema and slight erythema, no firmness or induration, site is acutely tender--specifically at the lateralmost aspect of the upper lid. Small area of conjunctival irritation noted with associated erythematous macule of lateral left upper lid. No LAD, no evidence of drainage, or pustule. CV: RRR, no murmur Resp: CTAB, no wheezes, non-labored Neuro: Alert and oriented, Speech clear, No gross deficits  Assessment and plan:  Hordeolum internum of left upper eyelid Patient is presenting with signs and symptoms consistent with hordeolum. No current evidence of surrounding tissue involvement. Patient has been using warm compresses on this area and it seems to be gradually improving. Patient was mostly worried that this could be something contagious. There is no current sign suggestive of infective cause. No reason to believe at this time that this could be canaliculitis. Patient said is completely  intact and there is no evidence of drainage or discharge. Will treat conservatively. - Warm compresses. At least 4 times a day for 15 minutes each time. - Tylenol for any discomfort. - I've asked that she try her best not to irritate this area. - We thoroughly discussed warning signs and indications to make immediate follow-up or visit the emergency department. Patient stated her understanding.    Kathee Delton, MD,MS,  PGY2 12/26/2015 2:50 PM

## 2015-12-26 NOTE — Patient Instructions (Signed)
Stye A stye is a bump on your eyelid caused by a bacterial infection. A stye can form inside the eyelid (internal stye) or outside the eyelid (external stye). An internal stye may be caused by an infected oil-producing gland inside your eyelid. An external stye may be caused by an infection at the base of your eyelash (hair follicle). Styes are very common. Anyone can get them at any age. They usually occur in just one eye, but you may have more than one in either eye.  CAUSES  The infection is almost always caused by bacteria called Staphylococcus aureus. This is a common type of bacteria that lives on your skin. RISK FACTORS You may be at higher risk for a stye if you have had one before. You may also be at higher risk if you have:  Diabetes.  Long-term illness.  Long-term eye redness.  A skin condition called seborrhea.  High fat levels in your blood (lipids). SIGNS AND SYMPTOMS  Eyelid pain is the most common symptom of a stye. Internal styes are more painful than external styes. Other signs and symptoms may include:  Painful swelling of your eyelid.  A scratchy feeling in your eye.  Tearing and redness of your eye.  Pus draining from the stye. DIAGNOSIS  Your health care provider may be able to diagnose a stye just by examining your eye. The health care provider may also check to make sure:  You do not have a fever or other signs of a more serious infection.  The infection has not spread to other parts of your eye or areas around your eye. TREATMENT  Most styes will clear up in a few days without treatment. In some cases, you may need to use antibiotic drops or ointment to prevent infection. Your health care provider may have to drain the stye surgically if your stye is:  Large.  Causing a lot of pain.  Interfering with your vision. This can be done using a thin blade or a needle.  HOME CARE INSTRUCTIONS   Take medicines only as directed by your health care  provider.  Apply a clean, warm compress to your eye for 10 minutes, 4 times a day.  Do not wear contact lenses or eye makeup until your stye has healed.  Do not try to pop or drain the stye. SEEK MEDICAL CARE IF:  You have chills or a fever.  Your stye does not go away after several days.  Your stye affects your vision.  Your eyeball becomes swollen, red, or painful. MAKE SURE YOU:  Understand these instructions.  Will watch your condition.  Will get help right away if you are not doing well or get worse.   This information is not intended to replace advice given to you by your health care provider. Make sure you discuss any questions you have with your health care provider.   Document Released: 08/18/2005 Document Revised: 11/29/2014 Document Reviewed: 02/22/2014 Elsevier Interactive Patient Education 2016 Elsevier Inc.  

## 2015-12-26 NOTE — Assessment & Plan Note (Signed)
Patient is presenting with signs and symptoms consistent with hordeolum. No current evidence of surrounding tissue involvement. Patient has been using warm compresses on this area and it seems to be gradually improving. Patient was mostly worried that this could be something contagious. There is no current sign suggestive of infective cause. No reason to believe at this time that this could be canaliculitis. Patient said is completely intact and there is no evidence of drainage or discharge. Will treat conservatively. - Warm compresses. At least 4 times a day for 15 minutes each time. - Tylenol for any discomfort. - I've asked that she try her best not to irritate this area. - We thoroughly discussed warning signs and indications to make immediate follow-up or visit the emergency department. Patient stated her understanding.

## 2016-06-02 ENCOUNTER — Encounter: Payer: Self-pay | Admitting: Student in an Organized Health Care Education/Training Program

## 2016-06-02 ENCOUNTER — Ambulatory Visit (INDEPENDENT_AMBULATORY_CARE_PROVIDER_SITE_OTHER): Payer: Medicaid Other | Admitting: Student in an Organized Health Care Education/Training Program

## 2016-06-02 VITALS — BP 100/73 | HR 62 | Temp 98.7°F | Wt 150.0 lb

## 2016-06-02 DIAGNOSIS — Z202 Contact with and (suspected) exposure to infections with a predominantly sexual mode of transmission: Secondary | ICD-10-CM | POA: Diagnosis present

## 2016-06-02 LAB — POCT WET PREP (WET MOUNT): Clue Cells Wet Prep Whiff POC: NEGATIVE

## 2016-06-02 NOTE — Progress Notes (Signed)
   CC: STI Exposure  HPI: Sierra Strickland is a 35 y.o. female who presents with concerns about STI exposure.  She indicates that she is asymptomatic however suspects that her partner may have had sexual contact with other partners.   She denies genital lesions, vaginal discharge, dysuria, inguinal lymphadenopathy, rash or dyspareunia.  No pelvic pain, no GI symptoms, no bleeding.  She does not use barrier protection and is unconcerned about pregnancy because she underwent tubal ligation 8 years ago.  Menstrual periods are regular.    Review of Symptoms: See HPI for ROS. All other systems reviewed and are negative. Pertinently, no chest pain, palpitations, SOB, fever, chills, abdominal pain, N/V/D.    CC, SH/smoking status, and VS noted.  Objective: BP 100/73 mmHg  Pulse 62  Temp(Src) 98.7 F (37.1 C) (Oral)  Wt 150 lb (68.04 kg)  SpO2 98%  LMP 05/19/2016  GEN: No apparent distress, cooperative, appropriate affect GI: soft, nontender, nondistended, normoactive bowel sounds, no hepatosplenomegaly GU: no CVA Tenderness GYN: External genitalia within normal limits and without lesions. Vaginal mucosa pink, moist, normal rugae.  Non-friable cervix without lesions. No discharge noted on speculum exam.  Assessment and plan:  Contact with or exposure to sexually transmitted disease  Patient denies symptoms.   - GC, trichomoniasis pending, will follow up over the phone. - wet prep: +few clue cells, (-) whiff; patient is asymptomatic and therefore will not treat for BV at this time - pt declined HIV or RPR test today, was counseled on exposure risks.  She was recently HIV(-) and RPR nonreactive in December 2016.    - Pt denies use of barrier protection.  No concern for pregnancy as she is [age]yrs s/p tubal ligation.  She states she does not use condoms because they cause discomfort/irritation.  Provided information on alternative barrier contraception and provided handout on alternatives to latex  condoms which may cause less discomfort. - Pt aware of risk of exposure to STI's even in absence of symptoms, strongly advised to protect herself from STI's with barrier protection.    Orders Placed This Encounter  Procedures  . POCT Wet [facility])    No orders of the defined types were placed in this encounter.     Howard PouchLauren Jade Burright, MD,MS,  PGY1 06/02/2016 4:28 PM

## 2016-06-02 NOTE — Assessment & Plan Note (Addendum)
Patient denies symptoms.   - GC, trichomoniasis pending, will follow up over the phone. - wet prep: +few clue cells, (-) whiff; patient is asymptomatic and therefore will not treat for BV at this time - pt declined HIV or RPR test today, was counseled on exposure risks.  She was recently HIV(-) and RPR nonreactive in December 2016.    - Pt denies use of barrier protection.  No concern for pregnancy as she is [age]yrs s/p tubal ligation.  She states she does not use condoms because they cause discomfort/irritation.  Provided information on alternative barrier contraception and provided handout on alternatives to latex condoms which may cause less discomfort. - Pt aware of risk of exposure to STI's even in absence of symptoms, strongly advised to protect herself from STI's with barrier protection.

## 2016-06-02 NOTE — Patient Instructions (Addendum)
It was a pleasure seeing you today in our clinic. Today we discussed STI testing and STI prevention. Here is the treatment plan we have discussed and agreed upon together: - We tested for gonorrhea, chlamydia, and trichomoniasis.   - We talked about HIV and syphilis screening which you opted out of today as it is a blood test. - If you develop symptoms such as rash, tenderness, discharge, difficulty urinating or pain with sexual intercourse please call our office for a follow up. - I will call you with the results of your tests. - Please consider using condoms to prevent STI transmission.  I included some information with different types of condoms below, and I would recommend trying a different kind that may cause less discomfort for you.  Our clinic's number is [phone]. Feel free to call any time with questions or concerns.   - Dr. Mosetta PuttFeng    TYPES OF CONDOMS  Material - Condoms are made of latex rubber, natural membranes, or synthetic material.  ?Rubber (latex) - Approximately 80 percent of female condoms available in the [place] are manufactured from natural rubber latex [2]. They are generally less expensive than condoms made from other materials. The dual protection provided by latex condoms against unintended pregnancy, as well as many STIs (particularly HIV), is well documented. However, latex condoms cannot be used by persons with latex sensitivity or allergy, and are not compatible with oil-based lubricants or medications. ?Natural membrane - A small proportion (<5 percent) of condoms are made from the intestinal cecum of lambs ("natural skin," "natural membrane," or "lambskin" condoms). While any type of lubricant can be used with these condoms, in contrast to latex condoms, natural membrane condoms contain small pores that may permit the passage of viruses, including hepatitis B virus, herpes simplex virus, and HIV [3,4]. No contraceptive or STI prevention effectiveness data are  available. Because of their porosity, they may not provide the same level of protection against STIs as latex condoms and should not be recommended for prevention of sexually transmitted infections [5]. ?Synthetic - Polyurethane and other synthetic materials are also used to manufacture condoms and account for the remaining 15 percent of condoms. Compared to latex condoms, synthetic condoms are generally non-allergenic, compatible with both oil-based and water-based lubricants, and have a longer shelf-life [6]. The effectiveness of synthetic condoms to prevent STIs has not been well-studied; however, synthetic condoms are believed to provide STI protection similar to latex condoms

## 2016-06-03 LAB — CERVICOVAGINAL ANCILLARY ONLY
Chlamydia: NEGATIVE
NEISSERIA GONORRHEA: NEGATIVE

## 2016-06-04 ENCOUNTER — Telehealth: Payer: Self-pay | Admitting: Student in an Organized Health Care Education/Training Program

## 2016-06-04 NOTE — Telephone Encounter (Signed)
Phone Call -  Spoke with patient regarding Chlamydia and Gonorrhea test results.  Informed Patient that the test resultswere negative.  Howard PouchLauren Keylen Eckenrode, MD

## 2017-04-21 ENCOUNTER — Other Ambulatory Visit (HOSPITAL_COMMUNITY)
Admission: RE | Admit: 2017-04-21 | Discharge: 2017-04-21 | Disposition: A | Discharge status: Home / Self Care | Payer: Medicaid Other | Source: Ambulatory Visit | Attending: Family Medicine | Admitting: Family Medicine

## 2017-04-21 ENCOUNTER — Encounter: Payer: Self-pay | Admitting: Family Medicine

## 2017-04-21 ENCOUNTER — Ambulatory Visit (INDEPENDENT_AMBULATORY_CARE_PROVIDER_SITE_OTHER): Payer: Medicaid Other | Admitting: Family Medicine

## 2017-04-21 VITALS — BP 132/76 | HR 71 | Temp 98.5°F | Ht 62.0 in | Wt 141.0 lb

## 2017-04-21 DIAGNOSIS — Z124 Encounter for screening for malignant neoplasm of cervix: Secondary | ICD-10-CM | POA: Diagnosis not present

## 2017-04-21 DIAGNOSIS — Z0000 Encounter for general adult medical examination without abnormal findings: Secondary | ICD-10-CM | POA: Diagnosis not present

## 2017-04-21 NOTE — Patient Instructions (Signed)
Everything looked normal on today's exam.  I will call you if your pap smear is NOT normal, otherwise you will get a letter in the mail.  Things to do to Keep yourself Healthy - Exercise at least 30-45 minutes a day,  3-4 days a week.  - Eat a low-fat diet with lots of fruits and vegetables, up to 7-9 servings per day. - Seatbelts can save your life. Wear them always. - Smoke detectors on every level of your home, check batteries every year. - Eye Doctor - have an eye exam every 1-2 years - Depression is common in our stressful world.If you're feeling down or losing interest in things you normally enjoy, please come in for a visit. - Violence - If anyone is threatening or hurting you, please call immediately.

## 2017-04-21 NOTE — Progress Notes (Signed)
36 y.o. year old12 female presents for well woman/preventative visit and annual GYN examination.  Acute Concerns: None  Diet: varied   Exercise: intermittently   Sexual/Birth History: has 4 children  Birth Control: s/p BTL  Review of Systems  Constitutional: Negative for chills, fever and malaise/fatigue.  HENT: Negative for congestion, ear discharge and sore throat.   Eyes: Negative for blurred vision and double vision.  Respiratory: Negative for cough and shortness of breath.   Cardiovascular: Negative for chest pain, palpitations and leg swelling.  Gastrointestinal: Negative for abdominal pain, blood in stool, constipation, diarrhea, heartburn, melena and vomiting.  Genitourinary: Negative for dysuria, frequency and urgency.  Musculoskeletal: Negative for myalgias.  Skin: Negative for rash.  Neurological: Negative for dizziness and headaches.  Psychiatric/Behavioral: Negative for depression. The patient is not nervous/anxious.       Social:  Social History   Social History  . Marital status: Single    Spouse name: N/A  . Number of children: N/A  . Years of education: N/A   Social History Main Topics  . Smoking status: Former Smoker    Quit date: 06/02/2014  . Smokeless tobacco: Never Used  . Alcohol use None  . Drug use: Yes    Types: Marijuana     Comment: smokes 2 blunts/day  . Sexual activity: Not Asked   Other Topics Concern  . None   Social History Narrative  . None    Immunization: Immunization History  Administered Date(s) Administered  . Hepatitis B 01/25/2007  . Influenza Whole 09/07/2007  . PPD Test 06/01/2011  . Td 06/23/1999, 06/09/2009    Cancer Screening:  Pap Smear: 05/2014- negative for malignancy and HR HPV  Mammogram: She has 1 or 2 paternal aunts with a history of breast cancer  Colonoscopy: no family h/o colon cancer    Physical Exam: VITALS: Reviewed Blood pressure 132/76, pulse 71, temperature 98.5 F (36.9 C), temperature  source Oral, height 5\' 2"  (1.575 m), weight 141 lb (64 kg), last menstrual period 04/07/2017, SpO2 99 %.  GEN: Pleasant female, NAD HEENT: Normocephalic,no scleral icterus, bilateral TM pearly grey, nasal septum midline, MMM, uvula midline, no anterior or posterior lymphadenopathy, no thyromegaly CARDIAC:RRR, S1 and S2 present, no murmur, no heaves/thrills RESP: CTAB, normal effort ABD: soft, no tenderness, normal bowel sounds GU/GYN:Exam performed in the presence of a chaperone. Normal external genitalia. Cervix unremarkable. Bimanual exam identified no masses EXT: No edema, 2+ radial and DP pulses SKIN: no rash  ASSESSMENT & PLAN: 36 y.o. female presents for annual well woman/preventative exam and GYN exam. Please see problem specific assessment and plan.   Health care maintenance Vaccinations up-to-date Pap smear performed today. If negative for epithelial changes and high risk HPV, can go 5 years until next testing Discussed starting mammograms at the age of 36 No need for any lab work today Follow-up in one year or sooner as needed

## 2017-04-21 NOTE — Assessment & Plan Note (Signed)
Vaccinations up-to-date Pap smear performed today. If negative for epithelial changes and high risk HPV, can go 5 years until next testing Discussed starting mammograms at the age of 36 No need for any lab work today Follow-up in one year or sooner as needed

## 2017-04-26 ENCOUNTER — Encounter: Payer: Self-pay | Admitting: Family Medicine

## 2017-04-26 LAB — CYTOLOGY - PAP
Adequacy: ABSENT
DIAGNOSIS: NEGATIVE
HPV (WINDOPATH): NOT DETECTED

## 2017-11-16 ENCOUNTER — Other Ambulatory Visit: Payer: Self-pay

## 2017-11-16 ENCOUNTER — Ambulatory Visit: Payer: Medicaid Other | Admitting: Family Medicine

## 2017-11-16 ENCOUNTER — Encounter: Payer: Self-pay | Admitting: Family Medicine

## 2017-11-16 VITALS — BP 106/76 | HR 81 | Temp 98.7°F | Ht 62.0 in | Wt 151.2 lb

## 2017-11-16 DIAGNOSIS — N898 Other specified noninflammatory disorders of vagina: Secondary | ICD-10-CM | POA: Diagnosis not present

## 2017-11-16 LAB — POCT WET PREP (WET MOUNT)
Clue Cells Wet Prep Whiff POC: POSITIVE
Trichomonas Wet Prep HPF POC: ABSENT

## 2017-11-16 MED ORDER — FLUCONAZOLE 150 MG PO TABS: 150.0000 mg | ORAL_TABLET | Freq: Once | ORAL | 0 refills | Status: AC

## 2017-11-16 MED ORDER — METRONIDAZOLE 500 MG PO TABS: 500.0000 mg | ORAL_TABLET | Freq: Two times a day (BID) | ORAL | 0 refills | Status: AC

## 2017-11-16 NOTE — Progress Notes (Signed)
Subjective:     Sierra Strickland is a 36 y.o. female who presents for evaluation of an abnormal vaginal discharge. Symptoms have been present for 3 days. Vaginal symptoms: discharge described as white and malodorous. Contraception: not discussed. She denies abnormal bleeding, burning and pain Sexually transmitted infection risk: very low risk of STD exposure. Menstrual flow: regular every 28-30 days. Patient reports changing soap recently.  The following portions of the patient's history were reviewed and updated as appropriate: allergies, current medications, past family history, past medical history, past social history, past surgical history and problem list.   Review of Systems Pertinent items are noted in HPI.    Objective:    BP 106/76   Pulse 81   Temp 98.7 F (37.1 C) (Oral)   Ht 5\' 2"  (1.575 m)   Wt 151 lb 3.2 oz (68.6 kg)   LMP 10/08/2017 (Approximate)   SpO2 97%   BMI 27.65 kg/m   General Appearance:    Alert, cooperative, no distress, appears stated age  Head:    Normocephalic, without obvious abnormality, atraumatic  Eyes:    PERRL, conjunctiva/corneas clear, EOM's intact, fundi    benign, both eyes  Ears:    Normal TM's and external ear canals, both ears  Nose:   Nares normal, septum midline, mucosa normal, no drainage    or sinus tenderness  Throat:   Lips, mucosa, and tongue normal; teeth and gums normal  Neck:   Supple, symmetrical, trachea midline, no adenopathy;    thyroid:  no enlargement/tenderness/nodules; no carotid   bruit or JVD  Back:     Symmetric, no curvature, ROM normal, no CVA tenderness  Lungs:     Clear to auscultation bilaterally, respirations unlabored  Chest Wall:    No tenderness or deformity   Heart:    Regular rate and rhythm, S1 and S2 normal, no murmur, rub   or gallop  Breast Exam:    No tenderness, masses, or nipple abnormality  Abdomen:     Soft, non-tender, bowel sounds active all four quadrants,    no masses, no organomegaly   Genitalia:    Normal female without lesion, malodorous white copious discharge, no lesion noted on cervix or vagina. No bleeding.  Rectal:    Normal tone, normal prostate, no masses or tenderness;   guaiac negative stool.  Extremities:   Extremities normal, atraumatic, no cyanosis or edema  Pulses:   2+ and symmetric all extremities  Skin:   Skin color, texture, turgor normal, no rashes or lesions  Lymph nodes:   Cervical, supraclavicular, and axillary nodes normal  Neurologic:   CNII-XII intact, normal strength, sensation and reflexes    throughout      Assessment:    Bacterial vaginosis. Wet prep with many clue cells, positive whiff test. No yeast, or trichomonas. Vaginal exam with white malodorous discharge. Low risk for STD. Patient decline GC/CH.   Plan:    --Prescribe metronidazole 500 mg bid for 7 days   --Prescribe Diflucan 150 mg x1 patient with hx for yeast infection post BV treatment

## 2017-11-16 NOTE — Progress Notes (Signed)
w

## 2017-11-16 NOTE — Patient Instructions (Signed)
Bacterial Vaginosis Bacterial vaginosis is a vaginal infection that occurs when the normal balance of bacteria in the vagina is disrupted. It results from an overgrowth of certain bacteria. This is the most common vaginal infection among women ages 15-44. Because bacterial vaginosis increases your risk for STIs (sexually transmitted infections), getting treated can help reduce your risk for chlamydia, gonorrhea, herpes, and HIV (human immunodeficiency virus). Treatment is also important for preventing complications in pregnant women, because this condition can cause an early (premature) delivery. What are the causes? This condition is caused by an increase in harmful bacteria that are normally present in small amounts in the vagina. However, the reason that the condition develops is not fully understood. What increases the risk? The following factors may make you more likely to develop this condition:  Having a new sexual partner or multiple sexual partners.  Having unprotected sex.  Douching.  Having an intrauterine device (IUD).  Smoking.  Drug and alcohol abuse.  Taking certain antibiotic medicines.  Being pregnant.  You cannot get bacterial vaginosis from toilet seats, bedding, swimming pools, or contact with objects around you. What are the signs or symptoms? Symptoms of this condition include:  Grey or white vaginal discharge. The discharge can also be watery or foamy.  A fish-like odor with discharge, especially after sexual intercourse or during menstruation.  Itching in and around the vagina.  Burning or pain with urination.  Some women with bacterial vaginosis have no signs or symptoms. How is this diagnosed? This condition is diagnosed based on:  Your medical history.  A physical exam of the vagina.  Testing a sample of vaginal fluid under a microscope to look for a large amount of bad bacteria or abnormal cells. Your health care provider may use a cotton swab  or a small wooden spatula to collect the sample.  How is this treated? This condition is treated with antibiotics. These may be given as a pill, a vaginal cream, or a medicine that is put into the vagina (suppository). If the condition comes back after treatment, a second round of antibiotics may be needed. Follow these instructions at home: Medicines  Take over-the-counter and prescription medicines only as told by your health care provider.  Take or use your antibiotic as told by your health care provider. Do not stop taking or using the antibiotic even if you start to feel better. General instructions  If you have a female sexual partner, tell her that you have a vaginal infection. She should see her health care provider and be treated if she has symptoms. If you have a female sexual partner, he does not need treatment.  During treatment: ? Avoid sexual activity until you finish treatment. ? Do not douche. ? Avoid alcohol as directed by your health care provider. ? Avoid breastfeeding as directed by your health care provider.  Drink enough water and fluids to keep your urine clear or pale yellow.  Keep the area around your vagina and rectum clean. ? Wash the area daily with warm water. ? Wipe yourself from front to back after using the toilet.  Keep all follow-up visits as told by your health care provider. This is important. How is this prevented?  Do not douche.  Wash the outside of your vagina with warm water only.  Use protection when having sex. This includes latex condoms and dental dams.  Limit how many sexual partners you have. To help prevent bacterial vaginosis, it is best to have sex with just   one partner (monogamous).  Make sure you and your sexual partner are tested for STIs.  Wear cotton or cotton-lined underwear.  Avoid wearing tight pants and pantyhose, especially during summer.  Limit the amount of alcohol that you drink.  Do not use any products that  contain nicotine or tobacco, such as cigarettes and e-cigarettes. If you need help quitting, ask your health care provider.  Do not use illegal drugs. Where to find more information:  Centers for Disease Control and Prevention: www.cdc.gov/std  American Sexual Health Association (ASHA): www.ashastd.org  U.S. Department of Health and Human Services, Office on Women's Health: www.womenshealth.gov/ or https://www.womenshealth.gov/a-z-topics/bacterial-vaginosis Contact a health care provider if:  Your symptoms do not improve, even after treatment.  You have more discharge or pain when urinating.  You have a fever.  You have pain in your abdomen.  You have pain during sex.  You have vaginal bleeding between periods. Summary  Bacterial vaginosis is a vaginal infection that occurs when the normal balance of bacteria in the vagina is disrupted.  Because bacterial vaginosis increases your risk for STIs (sexually transmitted infections), getting treated can help reduce your risk for chlamydia, gonorrhea, herpes, and HIV (human immunodeficiency virus). Treatment is also important for preventing complications in pregnant women, because the condition can cause an early (premature) delivery.  This condition is treated with antibiotic medicines. These may be given as a pill, a vaginal cream, or a medicine that is put into the vagina (suppository). This information is not intended to replace advice given to you by your health care provider. Make sure you discuss any questions you have with your health care provider. Document Released: 11/08/2005 Document Revised: 03/14/2017 Document Reviewed: 07/24/2016 Elsevier Interactive Patient Education  2018 Elsevier Inc.  

## 2018-12-11 ENCOUNTER — Ambulatory Visit (INDEPENDENT_AMBULATORY_CARE_PROVIDER_SITE_OTHER): Payer: Medicaid Other | Admitting: Family Medicine

## 2018-12-11 ENCOUNTER — Other Ambulatory Visit: Payer: Self-pay

## 2018-12-11 ENCOUNTER — Other Ambulatory Visit (HOSPITAL_COMMUNITY)
Admission: RE | Admit: 2018-12-11 | Discharge: 2018-12-11 | Disposition: A | Discharge status: Home / Self Care | Payer: Medicaid Other | Source: Ambulatory Visit | Attending: Family Medicine | Admitting: Family Medicine

## 2018-12-11 VITALS — BP 110/70 | HR 84 | Temp 98.7°F | Wt 159.0 lb

## 2018-12-11 DIAGNOSIS — Z3202 Encounter for pregnancy test, result negative: Secondary | ICD-10-CM | POA: Diagnosis not present

## 2018-12-11 DIAGNOSIS — G8929 Other chronic pain: Secondary | ICD-10-CM

## 2018-12-11 DIAGNOSIS — Z113 Encounter for screening for infections with a predominantly sexual mode of transmission: Secondary | ICD-10-CM

## 2018-12-11 DIAGNOSIS — Z114 Encounter for screening for human immunodeficiency virus [HIV]: Secondary | ICD-10-CM

## 2018-12-11 DIAGNOSIS — N926 Irregular menstruation, unspecified: Secondary | ICD-10-CM | POA: Diagnosis not present

## 2018-12-11 DIAGNOSIS — Z202 Contact with and (suspected) exposure to infections with a predominantly sexual mode of transmission: Secondary | ICD-10-CM | POA: Diagnosis not present

## 2018-12-11 DIAGNOSIS — M545 Low back pain: Secondary | ICD-10-CM

## 2018-12-11 LAB — POCT WET PREP (WET MOUNT)
CLUE CELLS WET PREP WHIFF POC: POSITIVE
TRICHOMONAS WET PREP HPF POC: ABSENT

## 2018-12-11 LAB — POCT URINE PREGNANCY: Preg Test, Ur: NEGATIVE

## 2018-12-11 MED ORDER — CYCLOBENZAPRINE HCL 10 MG PO TABS: 10.0000 mg | ORAL_TABLET | Freq: Every evening | ORAL | 0 refills | Status: DC | PRN

## 2018-12-11 NOTE — Progress Notes (Addendum)
  Subjective:    Patient ID: Sierra Strickland, female    DOB: 1980/12/12, 38 y.o.   MRN: [id number]   CC: STU testing   HPI:  STI testing: Patient here for STI testing after possible exposure.  Up to date on her Pap. ROS: Denies any abnormal vaginal bleeding, vaginal discharge, abdominal pain, fevers, chills, vomiting or diarrhea.   Back pain Patient reports that she has had back pain for a few years now.  States that she believes it started after getting multiple epidurals.  Is located over lower middle portion of back. patient reports that she has been doing stretches but believes that her back pain has order to get worse.  Also states that sometimes it seems like it is radiating up to her left shoulder, although she feels as if there is more tight muscles there.  Denies any numbness or tingling.  Has not tried any medications for this.  Patient reports that hot showers do seem to help her back. ROS: As above, no bowel or bladder incontinence  Smoking status reviewed  ROS: 10 point ROS is otherwise negative, except as mentioned in HPI  Patient Active Problem List   Diagnosis Date Noted  . 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 Possible exposure to STD 06/13/2014  . Contact with or exposure to other viral diseases(V01.79) 06/13/2014  . Upper back pain 06/13/2014  . Low back pain 03/05/2014  . Health care maintenance 04/04/2013  . TOBACCO USER 10/14/2009     Objective:  BP 110/70   Pulse 84   Temp 98.7 F (37.1 C) (Oral)   Wt 159 lb (72.1 kg)   LMP 11/23/2018 (Approximate)   BMI 29.08 kg/m  Vitals and nursing note reviewed  General: NAD, pleasant Respiratory: normal effort GU/GYN: External genitalia within normal limits.  Vaginal mucosa pink, moist, normal rugae.  Nonfriable cervix without lesions, no discharge or bleeding noted on speculum exam.  Exam performed in the presence of a chaperone. Extremities: no edema or cyanosis. WWP. Back: FROM of lumbar  spine with no tenderness on palpation along spine.  Tight muscles noted  Skin: warm and dry, no rashes noted Neuro: alert and oriented, no focal deficits Psych: normal affect, normal thought content, normal judgment  Assessment & Plan:    Low back pain Exam reassuring with no evidence of radiculopathy.  Patient to continue stretches and given handout on appropriate lifting as well as stretches.  Patient given Flexeril in the event of severe back spasms that she reports occasionally.  Patient also instructed that she could use ibuprofen and Tylenol for pain if needed however she reports she would like to avoid medications.  Patient also counseled on taking warm baths and massage therapy.  Possible exposure to STD Patient here for STD screening.  Normal vaginal exam. Wet prep grossly normal with signs that there may be bacterial vaginosis however patient is asymptomatic and discussed treatment options with her, but patient agrees not to treat unless she develops sx. -GC/chlamydia pending, HIV and RPR pending  [place] Moxon Messler, DO Family Medicine Resident PGY-2

## 2018-12-11 NOTE — Patient Instructions (Addendum)
Thank you for coming to see me today. It was a pleasure! Today we talked about:   We will call you with your lab results.  For your back pain he may take Tylenol or ibuprofen as needed for the pain.  It will be important that you continue to do stretches.  Applying a warm compress and taking hot baths may also help with your pain.  You may use massage therapy or yoga to help with your pain as well. I have given you some muscle relaxers to use if you back pain flares, but do not use this prior to driving as it can cause you to be sleepy.   Please follow-up with your regular doctor as needed.  If you have any questions or concerns, please do not hesitate to call the office at [phone].  Take Care,   [place] Obaloluwa Delatte, DO  Chronic Back Pain When back pain lasts longer than 3 months, it is called chronic back pain. Pain may get worse at certain times (flare-ups). There are things you can do at home to manage your pain. Follow these instructions at home: Activity      Avoid bending and other activities that make pain worse.  When standing: ? Keep your upper back and neck straight. ? Keep your shoulders pulled back. ? Avoid slouching.  When sitting: ? Keep your back straight. ? Relax your shoulders. Do not round your shoulders or pull them backward.  Do not sit or stand in one place for long periods of time.  Take short rest breaks during the day. Lying down or standing is usually better than sitting. Resting can help relieve pain.  When sitting or lying down for a long time, do some mild activity or stretching. This will help to prevent stiffness and pain.  Get regular exercise. Ask your doctor what activities are safe for you.  Do not lift anything that is heavier than 10 lb (4.5 kg). To prevent injury when you lift things: ? Bend your knees. ? Keep the weight close to your body. ? Avoid twisting. Managing pain  If told, put ice on the painful area. Your doctor may tell  you to use ice for 24-48 hours after a flare-up starts. ? Put ice in a plastic bag. ? Place a towel between your skin and the bag. ? Leave the ice on for 20 minutes, 2-3 times a day.  If told, put heat on the painful area as often as told by your doctor. Use the heat source that your doctor recommends, such as a moist heat pack or a heating pad. ? Place a towel between your skin and the heat source. ? Leave the heat on for 20-30 minutes. ? Remove the heat if your skin turns bright red. This is especially important if you are unable to feel pain, heat, or cold. You may have a greater risk of getting burned.  Soak in a warm bath. This can help relieve pain.  Take over-the-counter and prescription medicines only as told by your doctor. General instructions  Sleep on a firm mattress. Try lying on your side with your knees slightly bent. If you lie on your back, put a pillow under your knees.  Keep all follow-up visits as told by your doctor. This is important. Contact a doctor if:  You have pain that does not get better with rest or medicine. Get help right away if:  One or both of your arms or legs feel weak.  One  or both of your arms or legs lose feeling (numbness).  You have trouble controlling when you poop (bowel movement) or pee (urinate).  You feel sick to your stomach (nauseous).  You throw up (vomit).  You have belly (abdominal) pain.  You have shortness of breath.  You pass out (faint). Summary  When back pain lasts longer than 3 months, it is called chronic back pain.  Pain may get worse at certain times (flare-ups).  Use ice and heat as told by your doctor. Your doctor may tell you to use ice after flare-ups. This information is not intended to replace advice given to you by your health care provider. Make sure you discuss any questions you have with your health care provider. Document Released: 04/26/2008 Document Revised: 06/23/2017 Document Reviewed:  06/23/2017 Elsevier Interactive Patient Education  2019 [employer]

## 2018-12-12 LAB — CERVICOVAGINAL ANCILLARY ONLY
CHLAMYDIA, DNA PROBE: NEGATIVE
Neisseria Gonorrhea: NEGATIVE

## 2018-12-12 LAB — RPR: RPR: NONREACTIVE

## 2018-12-12 LAB — HIV ANTIBODY (ROUTINE TESTING W REFLEX): HIV Screen 4th Generation wRfx: NONREACTIVE

## 2018-12-12 NOTE — Assessment & Plan Note (Signed)
Exam reassuring with no evidence of radiculopathy.  Patient to continue stretches and given handout on appropriate lifting as well as stretches.  Patient given Flexeril in the event of severe back spasms that she reports occasionally.  Patient also instructed that she could use ibuprofen and Tylenol for pain if needed however she reports she would like to avoid medications.  Patient also counseled on taking warm baths and massage therapy.

## 2018-12-12 NOTE — Assessment & Plan Note (Addendum)
Patient here for STD screening.  Normal vaginal exam. Wet prep grossly normal with signs that there may be bacterial vaginosis however patient is asymptomatic and discussed treatment options with her, but patient agrees not to treat unless she develops sx. -GC/chlamydia pending, HIV and RPR pending

## 2019-08-29 ENCOUNTER — Ambulatory Visit (INDEPENDENT_AMBULATORY_CARE_PROVIDER_SITE_OTHER): Payer: Medicaid Other | Admitting: Family Medicine

## 2019-08-29 ENCOUNTER — Other Ambulatory Visit: Payer: Self-pay

## 2019-08-29 VITALS — BP 102/62 | HR 70 | Temp 99.0°F | Ht 62.0 in | Wt 162.1 lb

## 2019-08-29 DIAGNOSIS — R300 Dysuria: Secondary | ICD-10-CM | POA: Diagnosis present

## 2019-08-29 DIAGNOSIS — B373 Candidiasis of vulva and vagina: Secondary | ICD-10-CM

## 2019-08-29 DIAGNOSIS — Z3202 Encounter for pregnancy test, result negative: Secondary | ICD-10-CM | POA: Diagnosis not present

## 2019-08-29 DIAGNOSIS — B3731 Acute candidiasis of vulva and vagina: Secondary | ICD-10-CM

## 2019-08-29 DIAGNOSIS — N898 Other specified noninflammatory disorders of vagina: Secondary | ICD-10-CM

## 2019-08-29 LAB — POCT URINALYSIS DIP (MANUAL ENTRY)
Bilirubin, UA: NEGATIVE
Blood, UA: NEGATIVE
Glucose, UA: NEGATIVE mg/dL
Ketones, POC UA: NEGATIVE mg/dL
Leukocytes, UA: NEGATIVE
Nitrite, UA: NEGATIVE
Spec Grav, UA: 1.02 (ref 1.010–1.025)
Urobilinogen, UA: 0.2 E.U./dL
pH, UA: 7 (ref 5.0–8.0)

## 2019-08-29 LAB — POCT WET PREP (WET MOUNT)
Clue Cells Wet Prep Whiff POC: POSITIVE
Trichomonas Wet Prep HPF POC: ABSENT

## 2019-08-29 LAB — POCT URINE PREGNANCY: Preg Test, Ur: NEGATIVE

## 2019-08-29 MED ORDER — FLUCONAZOLE 150 MG PO TABS: 150.0000 mg | ORAL_TABLET | Freq: Once | ORAL | 0 refills | Status: AC

## 2019-08-29 MED ORDER — FLUCONAZOLE 150 MG PO TABS: 150.0000 mg | ORAL_TABLET | Freq: Once | ORAL | 0 refills | Status: DC

## 2019-08-29 NOTE — Progress Notes (Signed)
  Subjective  CC: Dysuria  FIE:PPIRJ Sierra Strickland is a 38 y.o. female who presents today with the following problems:  Vaginitis: Patient complains of an abnormal vaginal discharge for 2 days. Vaginal symptoms include burning, urinary symptoms of dysuria, lower abdominal pain, nausea and urinary frequency and vulvar itching.Vulvar symptoms include burning, discharge described as curd-like and vulvar itching.STI Risk: Very low risk of STD exposure, no new partners.  Pertinent P/F/SHx: No hx of frequent UTI or yeast infections.  ROS: Pertinent ROS included in HPI. Objective  Physical Exam:  BP 102/62   Pulse 70   Temp 99 F (37.2 C) (Oral)   Ht 5\' 2"  (1.575 m)   Wt 162 lb 2 oz (73.5 kg)   LMP 08/08/2019   SpO2 99%   BMI 29.65 kg/m  General:  Healthy female, NAD. Non-toxic.  GU: External vulva and vagina nonerythematous, without any obvious lesions or rash.  White, thick curd-like discharge appreciated. Normal ruggae of vaginal walls.  Cervix is non erythematous and non-friable.   Pertinent Labs:  UPT negative   Assessment & Plan    Problem List Items Addressed This Visit      Genitourinary   Vaginal yeast infection    UA negative and wet prep + for clue cells, yeast, and moderate bacteria. Will treat patient for yeast given symptoms of curd-like discharge, itching and pain with urination. Given it is unlikely for yeast and BV to co-exist due to pH, will hold on treating BV at this time, which could cause another yeast infection. Return precautions provided  - fluconazole 150 mg      Relevant Medications   fluconazole (DIFLUCAN) 150 MG tablet    Other Visit Diagnoses    Burning with urination    -  Primary   Relevant Orders   POCT urine pregnancy (Completed)   POCT urinalysis dipstick (Completed)   Vaginal discharge       Relevant Orders   POCT Wet Prep Sierra Strickland) (Completed)     Wilber Oliphant, M.D.  PGY-2  Family Medicine  [phone] 08/29/2019 10:54 AM

## 2019-08-29 NOTE — Patient Instructions (Signed)
Dear Sierra Strickland,   It was good to see you! Thank you for taking your time to come in to be seen. Today, we discussed the following:   Yeast Infection    Take one tablet of fluconazole today. If you still have symptoms in 72 hours, please call and request a one time refill for fluconazole.   If you continue to have no relief, please make an appointment for follow up.   Be well,   Zettie Cooley, M.D   [facility] [phone]  *Sign up for MyChart for instant access to your health profile, labs, orders, upcoming appointments or to contact your provider with questions*  ===================================================================================

## 2019-08-29 NOTE — Assessment & Plan Note (Addendum)
UA negative and wet prep + for clue cells, yeast, and moderate bacteria. Will treat patient for yeast given symptoms of curd-like discharge, itching and pain with urination. Given it is unlikely for yeast and BV to co-exist due to pH, will hold on treating BV at this time, which could cause another yeast infection. Return precautions provided  - fluconazole 150 mg

## 2020-07-11 ENCOUNTER — Ambulatory Visit: Payer: Medicaid Other | Admitting: Family Medicine

## 2020-07-11 ENCOUNTER — Encounter: Payer: Self-pay | Admitting: Family Medicine

## 2020-07-11 ENCOUNTER — Other Ambulatory Visit: Payer: Self-pay

## 2020-07-11 VITALS — BP 122/80 | HR 84 | Ht 62.0 in | Wt 176.0 lb

## 2020-07-11 DIAGNOSIS — H60542 Acute eczematoid otitis externa, left ear: Secondary | ICD-10-CM | POA: Diagnosis not present

## 2020-07-11 MED ORDER — TRIAMCINOLONE ACETONIDE 0.1 % EX OINT: 1.0000 "application " | TOPICAL_OINTMENT | Freq: Two times a day (BID) | CUTANEOUS | 0 refills | Status: DC

## 2020-07-11 NOTE — Assessment & Plan Note (Signed)
Patient reassured that she does not appear to have a true middle ear infection.  Patient showed anatomy of the ear and explained that fluid behind the middle ear would not be draining onto her pillow.  Suspect that trauma to the skin resulted in increased serous fluid and therefore drainage onto the pillow.  Appears that she has eczema on her outer ear.  Will prescribe Kenalog twice daily x2 weeks, if no improvement or if worsening in symptoms, or if develops fever, worsening ear pain, congestion, should be seen again.  Patient advised to not use steroids for more than 2 weeks at a time due to risk for thinning and lightening of skin.

## 2020-07-11 NOTE — Patient Instructions (Signed)
Thank you for coming to see me today. It was a pleasure. Today we talked about:   You have eczema on the outside of your ear.  Use the steroid cream ON THE OUTSIDE ONLY twice a day for 2 weeks.  If no improvement, come back.  Do not use a steroid cream for more than 2 weeks at a time as it can cause lightening of your skin.  Please follow-up with your PCP as scheduled.  If you have any questions or concerns, please do not hesitate to call the office at [phone].  Best,   Luis Abed, DO

## 2020-07-11 NOTE — Progress Notes (Signed)
    SUBJECTIVE:   CHIEF COMPLAINT / HPI:   Left Ear Pain Last few days has had ear pain Air going in ear makes it more sore Feeling better today Worst pain was 2 days ago States that it feels dry and "icky" Has been having clear discharge on pillow at night Tried moisturizing her ear for crusting, but pain was inside the ear Yesterday used black seed oil in the ear No fevers, congestion, sore throat Has never had this before No recent swimming Has a history of eczema per her report  PERTINENT  PMH / PSH: Tobacco Use  OBJECTIVE:   BP 122/80   Pulse 84   Ht 5\' 2"  (1.575 m)   Wt 176 lb (79.8 kg)   LMP 07/04/2020   SpO2 98%   BMI 32.19 kg/m    Physical Exam:  General: 39 y.o. female in NAD HEENT: B/L TMs clear without effusion, appropriate light reflex bilaterally, left external ear with scaling skin and mild erythema, no scaling skin within the ear canal, no tenderness palpation of bilateral mastoids, bilateral nares within normal limits with normal appearing turbinates, throat clear, no tenderness to palpation of TMJ, no crepitus with motion of jaw Lungs: Breathing comfortably on room air   ASSESSMENT/PLAN:   Eczema of left external ear Patient reassured that she does not appear to have a true middle ear infection.  Patient showed anatomy of the ear and explained that fluid behind the middle ear would not be draining onto her pillow.  Suspect that trauma to the skin resulted in increased serous fluid and therefore drainage onto the pillow.  Appears that she has eczema on her outer ear.  Will prescribe Kenalog twice daily x2 weeks, if no improvement or if worsening in symptoms, or if develops fever, worsening ear pain, congestion, should be seen again.  Patient advised to not use steroids for more than 2 weeks at a time due to risk for thinning and lightening of skin.     40, DO [facility]

## 2020-09-11 ENCOUNTER — Encounter (HOSPITAL_COMMUNITY): Payer: Self-pay

## 2020-09-11 ENCOUNTER — Other Ambulatory Visit: Payer: Self-pay

## 2020-09-11 ENCOUNTER — Ambulatory Visit (HOSPITAL_COMMUNITY)
Admission: EM | Admit: 2020-09-11 | Discharge: 2020-09-11 | Disposition: A | Discharge status: Home / Self Care | Payer: Medicaid Other | Attending: Family Medicine | Admitting: Family Medicine

## 2020-09-11 DIAGNOSIS — A084 Viral intestinal infection, unspecified: Secondary | ICD-10-CM

## 2020-09-11 LAB — POCT URINALYSIS DIPSTICK, ED / UC
Glucose, UA: NEGATIVE mg/dL
Ketones, ur: >=160 mg/dL — AB
Leukocytes,Ua: NEGATIVE
Nitrite: NEGATIVE
Protein, ur: NEGATIVE mg/dL
Specific Gravity, Urine: 1.025 (ref 1.005–1.030)
Urobilinogen, UA: 1 mg/dL (ref 0.0–1.0)
pH: 6 (ref 5.0–8.0)

## 2020-09-11 LAB — POC URINE PREG, ED: Preg Test, Ur: NEGATIVE

## 2020-09-11 MED ORDER — ONDANSETRON HCL 4 MG PO TABS: 4.0000 mg | ORAL_TABLET | Freq: Four times a day (QID) | ORAL | 0 refills | Status: DC

## 2020-09-11 NOTE — Discharge Instructions (Signed)
Please stay well hydrated  Please follow up if your symptoms fail to improve.  

## 2020-09-11 NOTE — ED Provider Notes (Signed)
[facility]    CSN: [id number] Arrival date & time: 09/11/20  1703      History   Chief Complaint Chief Complaint  Patient presents with  . Nausea  . Emesis    HPI Sierra Strickland is a 39 y.o. female.   She is presenting with a 4-day history of nausea and emesis.  She denies any fevers.  No one in her house has had similar symptoms.  She is able to eat or drink without being sick.  Denies any recent abdominal surgeries.  No change in the urination.  Had some improvement with the Dramamine.  Denies any bloody stools.  No diarrhea.  HPI  Past Medical History:  Diagnosis Date  . BELLS PALSY 04/08/2009   Qualifier: Diagnosis of  By: Wallene Huh  MD, [facility]      Patient Active Problem List   Diagnosis Date Noted  . 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 Vaginal yeast infection 06/18/2011  . TOBACCO USER 10/14/2009    History reviewed. No pertinent surgical history.  OB History   No obstetric history on file.      Home Medications    Prior to Admission medications   Medication Sig Start Date End Date Taking? Authorizing Provider  cyclobenzaprine (FLEXERIL) 10 MG tablet Take 1 tablet (10 mg total) by mouth at bedtime as needed for muscle spasms. 12/11/18   Shirley, [place], DO  ondansetron (ZOFRAN) 4 MG tablet Take 1 tablet (4 mg total) by mouth every 6 (six) hours. 09/11/20   Myra Rude, MD  triamcinolone ointment (KENALOG) 0.1 % Apply 1 application topically 2 (two) times daily. 07/11/20   Meccariello, Solmon Ice, DO    Family History History reviewed. No pertinent family history.  Social History Social History   Tobacco Use  . Smoking status: Former Smoker    Quit date: 06/02/2014    Years since quitting: 6.2  . Smokeless tobacco: Never Used  Substance Use Topics  . Alcohol use: Not on file  . Drug use: Yes    Types: Marijuana    Comment: smokes 2 blunts/day     Allergies   Patient has no known allergies.   Review of Systems Review of  Systems  See HPI  Physical Exam Triage Vital Signs ED Triage Vitals [09/11/20 1753]  Enc Vitals Group     BP 129/82     Pulse Rate 67     Resp 16     Temp 99.8 F (37.7 C)     Temp Source Oral     SpO2 99 %     Weight      Height      Head Circumference      Peak Flow      Pain Score 0     Pain Loc      Pain Edu?      Excl. in GC?    No data found.  Updated Vital Signs BP 129/82 (BP Location: Right Arm)   Pulse 67   Temp 99.8 F (37.7 C) (Oral)   Resp 16   LMP 08/29/2020   SpO2 99%   Visual Acuity Right Eye Distance:   Left Eye Distance:   Bilateral Distance:    Right Eye Near:   Left Eye Near:    Bilateral Near:     Physical Exam Gen: NAD, alert, cooperative with exam, ENT: normal lips, normal nasal mucosa,  Eye: normal EOM, normal conjunctiva and lids  GI: no masses  or tenderness, no hernia  Skin: no rashes, no areas of induration  Neuro: normal tone, normal sensation to touch Psych:  normal insight, alert and oriented    UC Treatments / Results  Labs (all labs ordered are listed, but only abnormal results are displayed) Labs Reviewed  POCT URINALYSIS DIPSTICK, ED / UC - Abnormal; Notable for the following components:      Result Value   Bilirubin Urine MODERATE (*)    Ketones, ur >=160 (*)    Hgb urine dipstick SMALL (*)    All other components within normal limits  POC URINE PREG, ED    EKG   Radiology No results found.  Procedures Procedures (including critical care time)  Medications Ordered in UC Medications - No data to display  Initial Impression / Assessment and Plan / UC Course  I have reviewed the triage vital signs and the nursing notes.  Pertinent labs & imaging results that were available during my care of the patient were reviewed by me and considered in my medical decision making (see chart for details).     Sierra Strickland is a 39 year old female is presenting with findings suggestive of viral gastroenteritis.   Urinalysis was showing some dehydration and negative pregnancy test.  Provided Zofran.  Counseled on supportive care.  Given indications on follow-up.  Final Clinical Impressions(s) / UC Diagnoses   Final diagnoses:  Viral gastroenteritis     Discharge Instructions     Please stay well hydrated  Please follow up if your symptoms fail to improve.     ED Prescriptions    Medication Sig Dispense Auth. Provider   ondansetron (ZOFRAN) 4 MG tablet Take 1 tablet (4 mg total) by mouth every 6 (six) hours. 12 tablet Myra Rude, MD     PDMP not reviewed this encounter.   Myra Rude, MD 09/11/20 [phone]

## 2020-09-11 NOTE — ED Triage Notes (Signed)
Pt present nausea and vomiting. Symptoms started on Monday.  Pt states that she tested negative for covid 19 yesterday per her job.

## 2020-10-13 NOTE — Progress Notes (Signed)
    SUBJECTIVE:   CHIEF COMPLAINT / HPI:   Ms. Mennella is a 39 yo F who presents for the below issue.   Pap Smear Last Pap smear 2018 with NILM/HPV(-).  Prior paps: WNL Patient's last menstrual period was 09/25/2020. Contraception: BTL 2009  Sexually Active: Yes  Desire for STD Screening: Yes  Concerns: N/a  PERTINENT  PMH / PSH: Hx of yeast infection, tobacco use disorder  OBJECTIVE:   BP 98/62   Pulse 67   Wt 177 lb 12.8 oz (80.6 kg)   LMP 09/25/2020   SpO2 98%   BMI 32.52 kg/m   General: Appears well, no acute distress. Age appropriate. Respiratory: normal effort Pelvic exam: normal external genitalia, vulva, vagina, cervix, uterus and adnexa, PAP: Pap smear done today, WET MOUNT done - results: clue cells, DNA probe for chlamydia and GC obtained, exam chaperoned by Melvenia Beam, CMA. Results for orders placed or performed in visit on 10/14/20 (from the past 24 hour(s))  POCT Wet Prep Mellody Drown Sedillo)     Status: Abnormal   Collection Time: 10/14/20  9:20 AM  Result Value Ref Range   Source Wet Prep POC VAG    WBC, Wet Prep HPF POC 0-3    Bacteria Wet Prep HPF POC Moderate (A) Few   Clue Cells Wet Prep HPF POC Moderate (A) None   Clue Cells Wet Prep Whiff POC Positive Whiff    Yeast Wet Prep HPF POC None None   KOH Wet Prep POC None None   Trichomonas Wet Prep HPF POC Absent Absent   ASSESSMENT/PLAN:  1. Encounter for Pap smear of cervix with HPV DNA cotesting - Cytology - PAP([place])  2. Encounter for screening examination for sexually transmitted disease - Cervicovaginal ancillary only - HCV Ab w Reflex to Quant PCR - HIV Antibody (routine testing w rflx) - RPR - POCT Wet Prep (Wet Mount)  3. Bacterial vaginosis - metroNIDAZOLE (FLAGYL) 500 MG tablet; Take 1 tablet (500 mg total) by mouth 2 (two) times daily for 7 days.  Dispense: 14 tablet; Refill: 0  Lavonda Jumbo, DO [facility]

## 2020-10-14 ENCOUNTER — Ambulatory Visit (INDEPENDENT_AMBULATORY_CARE_PROVIDER_SITE_OTHER): Payer: Medicaid Other | Admitting: Family Medicine

## 2020-10-14 ENCOUNTER — Other Ambulatory Visit: Payer: Self-pay

## 2020-10-14 ENCOUNTER — Other Ambulatory Visit (HOSPITAL_COMMUNITY)
Admission: RE | Admit: 2020-10-14 | Discharge: 2020-10-14 | Disposition: A | Discharge status: Home / Self Care | Payer: Medicaid Other | Source: Ambulatory Visit | Attending: Family Medicine | Admitting: Family Medicine

## 2020-10-14 ENCOUNTER — Encounter: Payer: Self-pay | Admitting: Family Medicine

## 2020-10-14 VITALS — BP 98/62 | HR 67 | Wt 177.8 lb

## 2020-10-14 DIAGNOSIS — N760 Acute vaginitis: Secondary | ICD-10-CM

## 2020-10-14 DIAGNOSIS — Z124 Encounter for screening for malignant neoplasm of cervix: Secondary | ICD-10-CM

## 2020-10-14 DIAGNOSIS — Z113 Encounter for screening for infections with a predominantly sexual mode of transmission: Secondary | ICD-10-CM

## 2020-10-14 DIAGNOSIS — B9689 Other specified bacterial agents as the cause of diseases classified elsewhere: Secondary | ICD-10-CM

## 2020-10-14 LAB — POCT WET PREP (WET MOUNT)
Clue Cells Wet Prep Whiff POC: POSITIVE
Trichomonas Wet Prep HPF POC: ABSENT

## 2020-10-14 MED ORDER — METRONIDAZOLE 500 MG PO TABS: 500.0000 mg | ORAL_TABLET | Freq: Two times a day (BID) | ORAL | 0 refills | Status: AC

## 2020-10-14 NOTE — Patient Instructions (Addendum)
It was wonderful to see you today.  Please bring ALL of your medications with you to every visit.   Today you were seen for your pap and lab work.   I will call you if anything is abnormal, otherwise I send a letter in the mail.   Please call the clinic at [phone] if you have any concerns. It was our pleasure to serve you.  Dr. Salvadore Dom

## 2020-10-15 LAB — HCV AB W REFLEX TO QUANT PCR: HCV Ab: <0.1 s/co ratio (ref 0.0–0.9)

## 2020-10-15 LAB — RPR: RPR Ser Ql: NONREACTIVE

## 2020-10-15 LAB — CERVICOVAGINAL ANCILLARY ONLY
Chlamydia: NEGATIVE
Comment: NEGATIVE
Comment: NORMAL
Neisseria Gonorrhea: NEGATIVE

## 2020-10-15 LAB — HCV INTERPRETATION

## 2020-10-15 LAB — HIV ANTIBODY (ROUTINE TESTING W REFLEX): HIV Screen 4th Generation wRfx: NONREACTIVE

## 2020-10-21 LAB — CYTOLOGY - PAP
Adequacy: ABSENT
Comment: NEGATIVE
Diagnosis: NEGATIVE
High risk HPV: NEGATIVE

## 2020-10-22 ENCOUNTER — Encounter: Payer: Self-pay | Admitting: Family Medicine

## 2021-02-24 ENCOUNTER — Encounter (HOSPITAL_COMMUNITY): Payer: Self-pay

## 2021-02-24 ENCOUNTER — Ambulatory Visit (HOSPITAL_COMMUNITY)
Admission: EM | Admit: 2021-02-24 | Discharge: 2021-02-24 | Disposition: A | Discharge status: Home / Self Care | Payer: Medicaid Other | Attending: Family Medicine | Admitting: Family Medicine

## 2021-02-24 ENCOUNTER — Other Ambulatory Visit: Payer: Self-pay

## 2021-02-24 DIAGNOSIS — N898 Other specified noninflammatory disorders of vagina: Secondary | ICD-10-CM | POA: Insufficient documentation

## 2021-02-24 DIAGNOSIS — Z113 Encounter for screening for infections with a predominantly sexual mode of transmission: Secondary | ICD-10-CM | POA: Diagnosis present

## 2021-02-24 NOTE — ED Provider Notes (Signed)
  [facility]   [id number] 02/24/21 Arrival Time: 1336  ASSESSMENT & PLAN:  1. Vaginal discharge   2. Screening for STDs (sexually transmitted diseases)    No empiric treatment desired.   Discharge Instructions     We have sent testing for sexually transmitted infections. We will notify you of any positive results once they are received. If required, we will prescribe any medications you might need.  Please refrain from all sexual activity for at least the next seven days.     Without s/s of PID.  Labs Reviewed  CERVICOVAGINAL ANCILLARY ONLY    Will notify of any positive results. Instructed to refrain from sexual activity for at least seven days.  Reviewed expectations re: course of current medical issues. Questions answered. Outlined signs and symptoms indicating need for more acute intervention. Patient verbalized understanding. After Visit Summary given.   SUBJECTIVE:  Sierra Strickland is a 40 y.o. female who presents with complaint of vaginal discharge. First noted 4 d ago; new partner; received oral sex. Worried re: STD. No pelvic/abd pain. No urinary symptoms. Afebrile.  Patient's last menstrual period was 02/15/2021.   OBJECTIVE:  Vitals:   02/24/21 1432  BP: 121/67  Pulse: 72  Resp: 18  Temp: 98.4 F (36.9 C)  TempSrc: Oral  SpO2: 98%     General appearance: alert, cooperative, appears stated age and no distress Lungs: unlabored respirations; speaks full sentences without difficulty Back: no CVA tenderness reporte; FROM at waist Abdomen: soft GU: deferred Skin: warm and dry Psychological: alert and cooperative; normal mood and affect.    Labs Reviewed  CERVICOVAGINAL ANCILLARY ONLY    No Known Allergies  Past Medical History:  Diagnosis Date  . BELLS PALSY 04/08/2009   Qualifier: Diagnosis of  By: Wallene Huh  MD, Rande Lawman     Family History  Family history unknown: Yes   Social History   Socioeconomic History  . Marital status:  Single    Spouse name: Not on file  . Number of children: Not on file  . Years of education: Not on file  . Highest education level: Not on file  Occupational History  . Not on file  Tobacco Use  . Smoking status: Former Smoker    Quit date: 06/02/2014    Years since quitting: 6.7  . Smokeless tobacco: Never Used  Substance and Sexual Activity  . Alcohol use: Not on file  . Drug use: Yes    Types: Marijuana    Comment: smokes 2 blunts/day  . Sexual activity: Not on file  Other Topics Concern  . Not on file  Social History Narrative  . Not on file   Social Determinants of Health   Financial Resource Strain: Not on file  Food Insecurity: Not on file  Transportation Needs: Not on file  Physical Activity: Not on file  Stress: Not on file  Social Connections: Not on file  Intimate Partner Violence: Not on file          Mardella Layman, MD 02/24/21 [phone]

## 2021-02-24 NOTE — Discharge Instructions (Addendum)
We have sent testing for sexually transmitted infections. We will notify you of any positive results once they are received. If required, we will prescribe any medications you might need.  Please refrain from all sexual activity for at least the next seven days.  

## 2021-02-24 NOTE — ED Triage Notes (Signed)
Pt presents with vaginal discharge X 4 days.

## 2021-02-25 ENCOUNTER — Telehealth (HOSPITAL_COMMUNITY): Payer: Self-pay | Admitting: Emergency Medicine

## 2021-02-25 LAB — CERVICOVAGINAL ANCILLARY ONLY
Bacterial Vaginitis (gardnerella): POSITIVE — AB
Candida Glabrata: NEGATIVE
Candida Vaginitis: NEGATIVE
Chlamydia: NEGATIVE
Comment: NEGATIVE
Comment: NEGATIVE
Comment: NEGATIVE
Comment: NEGATIVE
Comment: NEGATIVE
Comment: NORMAL
Neisseria Gonorrhea: NEGATIVE
Trichomonas: NEGATIVE

## 2021-02-25 MED ORDER — FLUCONAZOLE 150 MG PO TABS: 150.0000 mg | ORAL_TABLET | Freq: Every day | ORAL | 0 refills | Status: DC

## 2021-02-25 MED ORDER — METRONIDAZOLE 500 MG PO TABS: 500.0000 mg | ORAL_TABLET | Freq: Two times a day (BID) | ORAL | 0 refills | Status: DC

## 2021-08-14 NOTE — Progress Notes (Deleted)
    SUBJECTIVE:   Chief compliant/HPI: annual examination  Sierra Strickland is a 40 y.o. who presents today for an annual exam.   History tabs reviewed and updated ***.   Review of systems form reviewed and notable for ***.   OBJECTIVE:   There were no vitals taken for this visit.  ***  ASSESSMENT/PLAN:   No problem-specific Assessment & Plan notes found for this encounter.    Annual Examination  See AVS for age appropriate recommendations.   PHQ score ***, reviewed and discussed.  Blood pressure reviewed and at goal ***.  Asked about intimate partner violence and resources given as appropriate  The patient currently uses *** for contraception. Folate recommended as appropriate, minimum of 400 mcg per day.   Considered the following items based upon USPSTF recommendations: Diabetes screening: {discussed/ordered:14545} Screening for elevated cholesterol: {discussed/ordered:14545} HIV testing: {discussed/ordered:14545} Hepatitis C: {discussed/ordered:14545} Hepatitis B: {discussed/ordered:14545} Syphilis if at high risk: {discussed/ordered:14545} GC/CT {GC/CT screening :23818} Reviewed risk factors for latent tuberculosis and {not indicated/requested/declined:14582} Reviewed risk factors for osteoporosis. Using FRAX tool estimated risk of major osteoporotic fracture of  ***%, early screening {ordered not order:23822::"not ordered"}   Discussed family history, BRCA testing {not indicated/requested/declined:14582}. Tool used to risk stratify was ***.  Cervical cancer screening: {PAPTYPE:23819} Breast cancer screening: {mammoscreen:23820} Colorectal cancer screening: {crcscreen:23821::"discussed, colonoscopy ordered"} if age 72 or over.   Follow up in 1 *** year or sooner if indicated.    Lattie Haw, MD [place]

## 2021-08-17 ENCOUNTER — Encounter: Payer: Medicaid Other | Admitting: Family Medicine

## 2021-08-17 ENCOUNTER — Other Ambulatory Visit: Payer: Self-pay

## 2021-10-13 ENCOUNTER — Other Ambulatory Visit (HOSPITAL_COMMUNITY)
Admission: RE | Admit: 2021-10-13 | Discharge: 2021-10-13 | Disposition: A | Discharge status: Home / Self Care | Payer: Medicaid Other | Source: Ambulatory Visit | Attending: Family Medicine | Admitting: Family Medicine

## 2021-10-13 ENCOUNTER — Other Ambulatory Visit: Payer: Self-pay

## 2021-10-13 ENCOUNTER — Ambulatory Visit (INDEPENDENT_AMBULATORY_CARE_PROVIDER_SITE_OTHER): Payer: Medicaid Other | Admitting: Family Medicine

## 2021-10-13 VITALS — BP 102/66 | HR 70 | Wt 182.2 lb

## 2021-10-13 DIAGNOSIS — B9689 Other specified bacterial agents as the cause of diseases classified elsewhere: Secondary | ICD-10-CM | POA: Diagnosis not present

## 2021-10-13 DIAGNOSIS — R109 Unspecified abdominal pain: Secondary | ICD-10-CM | POA: Diagnosis present

## 2021-10-13 DIAGNOSIS — N760 Acute vaginitis: Secondary | ICD-10-CM | POA: Diagnosis not present

## 2021-10-13 DIAGNOSIS — Z113 Encounter for screening for infections with a predominantly sexual mode of transmission: Secondary | ICD-10-CM | POA: Diagnosis present

## 2021-10-13 DIAGNOSIS — S76011A Strain of muscle, fascia and tendon of right hip, initial encounter: Secondary | ICD-10-CM | POA: Diagnosis not present

## 2021-10-13 LAB — POCT WET PREP (WET MOUNT)
Clue Cells Wet Prep Whiff POC: POSITIVE
Trichomonas Wet Prep HPF POC: ABSENT

## 2021-10-13 MED ORDER — NAPROXEN 500 MG PO TABS: 500.0000 mg | ORAL_TABLET | Freq: Two times a day (BID) | ORAL | 0 refills | Status: AC

## 2021-10-13 NOTE — Progress Notes (Signed)
    SUBJECTIVE:   CHIEF COMPLAINT / HPI:   Routine STD testing Patient requests routine STD testing today. Denies symptoms including vaginal discharge, vaginal itching, dysuria, urinary frequency, dyspareunia, etc. Reports she is sexually active with 1 female partner. Hx of BTL for contraception. No barrier protection. Remote hx of chlamydia once ~20 years ago, but otherwise no STDs. She does have a hx of BV and yeast infections on multiple occasions.  Right Groin Pain Patient reports right lower abdominal/hip pain for the past few months. It is present daily. Sometimes worse with walking, especially going up stairs. No nausea, vomiting, fevers, back pain, or urinary symptoms. Normal menstrual cycle. No numbness/tingling in her legs. Has not tried anything for relief. Wonders if it's one of her GYN organs causing her pain.  PERTINENT  PMH / PSH: none  OBJECTIVE:   BP 102/66   Pulse 70   Wt 182 lb 4 oz (82.7 kg)   LMP 09/18/2021   SpO2 99%   BMI 33.33 kg/m   General: NAD, pleasant, able to participate in exam Respiratory: No respiratory distress Skin: warm and dry, no rashes noted Psych: Normal affect and mood Neuro: grossly intact GI: abdomen soft, nontender, nondistended MSK: pain with palpation of right iliopsoas region, pain with resisted straight leg raise, pain with flexion of R hip past 90 degrees.   GU/GYN: Exam performed in the presence of a chaperone. External genitalia within normal limits.  Vaginal mucosa pink, moist, normal rugae.  Nonfriable cervix without lesions, no discharge or bleeding noted on speculum exam.  Bimanual exam revealed normal, nongravid uterus.  No cervical motion tenderness. No adnexal masses bilaterally.     ASSESSMENT/PLAN:   Routine STI Testing Asymptomatic, overall low risk patient. UTD on Pap.  -GC/chlamydia, trich, HIV and RPR obtained today -Wet prep also obtained and was positive for BV-- Rx sent for metronidazole -Patient will be  informed of results via telephone  Strain of flexor muscle of right hip Patient with few months of right hip pain, likely hip flexor strain based on exam (tenderness to palpation, pain with resisted straight leg raise, pain with flexion past 90 degrees). No concern for intra-articular etiology or intra-abdominal pathology at this time. -Naproxen 500mg  BID x7 days -Referral to physical therapy     , MD [facility]

## 2021-10-13 NOTE — Patient Instructions (Addendum)
It was great to meet you!  Today we did routine testing for sexually transmitted infections. I will call you with these results.  It also seems you have a strain of your right hip flexor muscles. I have prescribed an anti-inflammatory medication to take twice daily for the next week. Do not take Ibuprofen in addition to this medicine. I have placed a referral to physical therapy. They will call you for an appointment.  Take care, Dr. Anner Crete

## 2021-10-14 DIAGNOSIS — S76011A Strain of muscle, fascia and tendon of right hip, initial encounter: Secondary | ICD-10-CM | POA: Insufficient documentation

## 2021-10-14 LAB — CERVICOVAGINAL ANCILLARY ONLY
Chlamydia: NEGATIVE
Comment: NEGATIVE
Comment: NORMAL
Neisseria Gonorrhea: NEGATIVE

## 2021-10-14 LAB — HIV ANTIBODY (ROUTINE TESTING W REFLEX): HIV Screen 4th Generation wRfx: NONREACTIVE

## 2021-10-14 LAB — RPR: RPR Ser Ql: NONREACTIVE

## 2021-10-14 MED ORDER — METRONIDAZOLE 500 MG PO TABS: 500.0000 mg | ORAL_TABLET | Freq: Two times a day (BID) | ORAL | 0 refills | Status: AC

## 2021-10-14 NOTE — Assessment & Plan Note (Signed)
Patient with few months of right hip pain, likely hip flexor strain based on exam (tenderness to palpation, pain with resisted straight leg raise, pain with flexion past 90 degrees). No concern for intra-articular etiology or intra-abdominal pathology at this time. -Naproxen 500mg  BID x7 days -Referral to physical therapy

## 2021-11-04 ENCOUNTER — Ambulatory Visit: Payer: Medicaid Other | Attending: Family Medicine

## 2021-11-04 NOTE — Therapy (Deleted)
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  ONSET DATE: ***  SUBJECTIVE:   SUBJECTIVE STATEMENT: ***  PERTINENT HISTORY: ***  PAIN:  Are you having pain? {yes/no:20286} VAS scale: ***/10 Pain location: *** Pain orientation: {Pain Orientation:25161}  PAIN TYPE: {type:313116} Pain description: {PAIN DESCRIPTION:21022940}  Aggravating factors: *** Relieving factors: ***  PRECAUTIONS: {Therapy precautions:24002}  WEIGHT BEARING RESTRICTIONS {Yes ***/No:24003}  FALLS:  Has patient fallen in last 6 months? {yes/no:20286}, Number of falls: ***  LIVING ENVIRONMENT: Lives with: {[facility] lives with:25569::"lives with their family"} Lives in: {Lives in:25570} Stairs: {yes/no:20286}; {Stairs:24000} Has following equipment at home: {Assistive devices:23999}  OCCUPATION: ***  PLOF: {PLOF:24004}  PATIENT GOALS ***   OBJECTIVE:   DIAGNOSTIC FINDINGS: ***  PATIENT SURVEYS:  {rehab surveys:24030}  COGNITION:  Overall cognitive status: {cognition:24006}     SENSATION:  Light touch: {intact/deficits:24005}  Stereognosis: {intact/deficits:24005}  Hot/Cold: {intact/deficits:24005}  Proprioception: {intact/deficits:24005}  MUSCLE LENGTH: Hamstrings: Right *** deg; Left ***  deg Thomas test: Right *** deg; Left *** deg  POSTURE:  ***  LE AROM/PROM:  A/PROM Right 11/04/2021 Left 11/04/2021  Hip flexion    Hip extension    Hip abduction    Hip adduction    Hip internal rotation    Hip external rotation    Knee flexion    Knee extension    Ankle dorsiflexion    Ankle plantarflexion    Ankle inversion    Ankle eversion     (Blank rows = not tested)  LE MMT:  MMT Right 11/04/2021 Left 11/04/2021  Hip flexion    Hip extension    Hip abduction    Hip adduction    Hip internal rotation    Hip external rotation    Knee flexion    Knee extension    Ankle dorsiflexion    Ankle plantarflexion    Ankle inversion    Ankle eversion     (Blank rows = not tested)  LOWER EXTREMITY SPECIAL TESTS:  {LEspecialtests:26242}  JOINT MOBILITY ASSESSMENT:  ***  FUNCTIONAL TESTS:  {Functional tests:24029}  GAIT: Distance walked: *** Assistive device utilized: {Assistive devices:23999} Level of assistance: {Levels of assistance:24026} Comments: ***    TODAY'S TREATMENT: ***   PATIENT EDUCATION:  Education details: *** Person educated: {Person educated:25204} Education method: {Education Method:25205} Education comprehension: {Education Comprehension:25206}   HOME EXERCISE PROGRAM: ***  ASSESSMENT:  CLINICAL IMPRESSION: Patient is a *** y.o. *** who was seen today for physical therapy evaluation and treatment for ***. Objective impairments include {opptimpairments:25111}. These impairments are limiting patient from {activity limitations:25113}. Personal factors including {Personal factors:25162} are also affecting patient's functional outcome. Patient will benefit from skilled PT to address above impairments and improve overall function.  REHAB POTENTIAL: {rehabpotential:25112}  CLINICAL DECISION MAKING: {clinical decision making:25114}  EVALUATION COMPLEXITY: {Evaluation complexity:25115}   GOALS: Goals reviewed with patient?  {yes/no:20286}  SHORT TERM GOALS:  STG Name Target Date Goal status  1 *** Baseline:  {follow up:25551} {GOALSTATUS:25110}  2 *** Baseline:  {follow up:25551} {GOALSTATUS:25110}  3 *** Baseline: {follow up:25551} {GOALSTATUS:25110}  4 *** Baseline: {follow up:25551} {GOALSTATUS:25110}  5 *** Baseline: {follow up:25551} {GOALSTATUS:25110}  6 *** Baseline: {follow up:25551} {GOALSTATUS:25110}  7 *** Baseline: {follow up:25551} {GOALSTATUS:25110}   LONG TERM GOALS:   LTG Name Target Date Goal status  1 *** Baseline: {follow up:25551} {GOALSTATUS:25110}  2 *** Baseline: {follow up:25551} {GOALSTATUS:25110}  3 *** Baseline: {follow up:25551} {GOALSTATUS:25110}  4 *** Baseline: {follow up:25551} {GOALSTATUS:25110}  5 *** Baseline: {follow up:25551} {GOALSTATUS:25110}  6 *** Baseline: {follow up:25551} {GOALSTATUS:25110}  7 *** Baseline: {follow up:25551} {GOALSTATUS:25110}   PLAN: PT FREQUENCY: {rehab frequency:25116}  PT DURATION: {rehab duration:25117}  PLANNED INTERVENTIONS: {rehab planned interventions:25118::"Therapeutic exercises","Therapeutic activity","Neuro Muscular re-education","Balance training","Gait training","Patient/Family education","Joint mobilization"}  PLAN FOR NEXT SESSION: [place] 11/04/2021, 5:27 PM

## 2022-03-07 ENCOUNTER — Encounter (HOSPITAL_COMMUNITY): Payer: Self-pay | Admitting: *Deleted

## 2022-03-07 ENCOUNTER — Ambulatory Visit (HOSPITAL_COMMUNITY)
Admission: EM | Admit: 2022-03-07 | Discharge: 2022-03-07 | Disposition: A | Discharge status: Home / Self Care | Payer: Medicaid Other | Attending: Internal Medicine | Admitting: Internal Medicine

## 2022-03-07 ENCOUNTER — Other Ambulatory Visit: Payer: Self-pay

## 2022-03-07 DIAGNOSIS — B3731 Acute candidiasis of vulva and vagina: Secondary | ICD-10-CM | POA: Insufficient documentation

## 2022-03-07 DIAGNOSIS — N760 Acute vaginitis: Secondary | ICD-10-CM | POA: Diagnosis present

## 2022-03-07 DIAGNOSIS — B9689 Other specified bacterial agents as the cause of diseases classified elsewhere: Secondary | ICD-10-CM | POA: Diagnosis not present

## 2022-03-07 LAB — POCT URINALYSIS DIPSTICK, ED / UC
Bilirubin Urine: NEGATIVE
Glucose, UA: NEGATIVE mg/dL
Ketones, ur: NEGATIVE mg/dL
Nitrite: NEGATIVE
Protein, ur: NEGATIVE mg/dL
Specific Gravity, Urine: >=1.03 (ref 1.005–1.030)
Urobilinogen, UA: 0.2 mg/dL (ref 0.0–1.0)
pH: 5.5 (ref 5.0–8.0)

## 2022-03-07 LAB — POC URINE PREG, ED: Preg Test, Ur: NEGATIVE

## 2022-03-07 MED ORDER — METRONIDAZOLE 500 MG PO TABS: 500.0000 mg | ORAL_TABLET | Freq: Two times a day (BID) | ORAL | 0 refills | Status: AC

## 2022-03-07 MED ORDER — FLUCONAZOLE 150 MG PO TABS: ORAL_TABLET | ORAL | 0 refills | Status: AC

## 2022-03-07 NOTE — ED Provider Notes (Signed)
?[facility] ? ? ? ?CSN: [id number] ?Arrival date & time: 03/07/22  1001 ?  ? ?HISTORY  ? ?Chief Complaint  ?Patient presents with  ? Vaginal Discharge  ? ?HPI ?Sierra Strickland is a 41 y.o. female. Patient complains of small amount of vaginal discharge, vaginal itching and burning.  Patient states that she particularly notices the burning after she urinates when the urine comes into contact with her vulva.  Patient states she has had episodes of BV as well as yeast in the past, states she is not able to differentiate between the 2 at this time, states she has noticed an unusual odor but does not typically have this much itching burning with BV.  Patient denies burning with urination, pelvic pain, pelvic pressure, exposure to STD, states has been in a monogamous relationship for the past 5 years and is not concerned about gonorrhea, trichomoniasis or chlamydia.  Patient denies fever, aches, chills, nausea, vomiting, diarrhea, breast tenderness.  Urine pregnancy test today is negative.  Urine dip revealed trace blood (patient states she is expecting her period in a few days) and trace white blood cells. ? ?The history is provided by the patient.  ?Past Medical History:  ?Diagnosis Date  ? BELLS PALSY 04/08/2009  ? Qualifier: Diagnosis of  By: Wallene Huh  MD, Rande Lawman    ? ?Patient Active Problem List  ? Diagnosis Date Noted  ? Strain of flexor muscle of right hip 10/14/2021  ? 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 TOBACCO USER 10/14/2009  ? ?Past Surgical History:  ?Procedure Laterality Date  ? TUBAL LIGATION  2009  ? ?OB History   ?No obstetric history on file. ?  ? ?Home Medications   ? ?Prior to Admission medications   ?Not on File  ? ?Family History ?Family History  ?Family history unknown: Yes  ? ?Social History ?Social History  ? ?Tobacco Use  ? Smoking status: Former  ?  Types: Cigarettes  ?  Quit date: 06/02/2014  ?  Years since quitting: 7.7  ? Smokeless tobacco: Never  ?Substance Use Topics  ? Drug use:  Yes  ?  Types: Marijuana  ?  Comment: smokes 2 blunts/day  ? ?Allergies   ?Patient has no known allergies. ? ?Review of Systems ?Review of Systems ?Pertinent findings noted in history of present illness.  ? ?Physical Exam ?Triage Vital Signs ?ED Triage Vitals  ?Enc Vitals Group  ?   BP 09/18/21 0827 (!) 147/82  ?   Pulse Rate 09/18/21 0827 72  ?   Resp 09/18/21 0827 18  ?   Temp 09/18/21 0827 98.3 ?F (36.8 ?C)  ?   Temp Source 09/18/21 0827 Oral  ?   SpO2 09/18/21 0827 98 %  ?   Weight --   ?   Height --   ?   Head Circumference --   ?   Peak Flow --   ?   Pain Score 09/18/21 0826 5  ?   Pain Loc --   ?   Pain Edu? --   ?   Excl. in GC? --   ?No data found. ? ?Updated Vital Signs ?BP 119/76   Pulse 85   Temp 99.1 ?F (37.3 ?C)   Resp 18   LMP 02/07/2022   SpO2 95%  ? ?Physical Exam ?Vitals and nursing note reviewed.  ?Constitutional:   ?   General: She is not in acute distress. ?   Appearance: Normal appearance. She is not  ill-appearing.  ?HENT:  ?   Head: Normocephalic and atraumatic.  ?Eyes:  ?   General: Lids are normal.     ?   Right eye: No discharge.     ?   Left eye: No discharge.  ?   Extraocular Movements: Extraocular movements intact.  ?   Conjunctiva/sclera: Conjunctivae normal.  ?   Right eye: Right conjunctiva is not injected.  ?   Left eye: Left conjunctiva is not injected.  ?Neck:  ?   Trachea: Trachea and phonation normal.  ?Cardiovascular:  ?   Rate and Rhythm: Normal rate and regular rhythm.  ?   Pulses: Normal pulses.  ?   Heart sounds: Normal heart sounds. No murmur heard. ?  No friction rub. No gallop.  ?Pulmonary:  ?   Effort: Pulmonary effort is normal. No accessory muscle usage, prolonged expiration or respiratory distress.  ?   Breath sounds: Normal breath sounds. No stridor, decreased air movement or transmitted upper airway sounds. No decreased breath sounds, wheezing, rhonchi or rales.  ?Chest:  ?   Chest wall: No tenderness.  ?Genitourinary: ?   Comments: Patient politely declines  pelvic exam today, patient provided a vaginal swab for testing. ?Musculoskeletal:     ?   General: Normal range of motion.  ?   Cervical back: Normal range of motion and neck supple. Normal range of motion.  ?Lymphadenopathy:  ?   Cervical: No cervical adenopathy.  ?Skin: ?   General: Skin is warm and dry.  ?   Findings: No erythema or rash.  ?Neurological:  ?   General: No focal deficit present.  ?   Mental Status: She is alert and oriented to person, place, and time.  ?Psychiatric:     ?   Mood and Affect: Mood normal.     ?   Behavior: Behavior normal.  ? ? ?Visual Acuity ?Right Eye Distance:   ?Left Eye Distance:   ?Bilateral Distance:   ? ?Right Eye Near:   ?Left Eye Near:    ?Bilateral Near:    ? ?UC Couse / Diagnostics / Procedures:  ?  ?EKG ? ?Radiology ?No results found. ? ?Procedures ?Procedures (including critical care time) ? ?UC Diagnoses / Final Clinical Impressions(s)   ?I have reviewed the triage vital signs and the nursing notes. ? ?Pertinent labs & imaging results that were available during my care of the patient were reviewed by me and considered in my medical decision making (see chart for details).   ? ?Final diagnoses:  ?BV (bacterial vaginosis)  ?Vaginal yeast infection  ? ?Patient was provided with Metronidazole 500 mg twice daily for 7 days for empiric treatment of presumed gardnerella vaginosis based on the history provided to me today. ?  ?Patient was provided with Diflucan 150 mg every 3 days x 2 for empiric treatment of presumed vulvovaginal candidiasis based on the history provided to me today. ?  ?Patient was advised to abstain from sexual intercourse for the next 7 days while being treated.  Patient was also advised to use condoms to protect themselves from STD exposure. ?  ?STD screening was performed, patient advised that the results be posted to their MyChart and if any of the results are positive, they will be notified by phone, further treatment will be provided as indicated  based on results of STD screening. ?  ?Return precautions advised.  Drug allergies reviewed, all questions addressed.  ? ? ? ?ED Prescriptions   ? ? Medication Sig  Dispense Auth. Provider  ? fluconazole (DIFLUCAN) 150 MG tablet Take 1 tablet today.  Take second tablet 3 days later.  Take third tablet 3 days after second tablet. 3 tablet Theadora Rama Scales, PA-C  ? metroNIDAZOLE (FLAGYL) 500 MG tablet Take 1 tablet (500 mg total) by mouth 2 (two) times daily for 7 days. 14 tablet Theadora Rama Scales, PA-C  ? ?  ? ?PDMP not reviewed this encounter. ? ?Pending results:  ?Labs Reviewed  ?POCT URINALYSIS DIPSTICK, ED / UC - Abnormal; Notable for the following components:  ?    Result Value  ? Hgb urine dipstick SMALL (*)   ? Leukocytes,Ua TRACE (*)   ? All other components within normal limits  ?POC URINE PREG, ED  ?CERVICOVAGINAL ANCILLARY ONLY  ? ? ?Medications Ordered in UC: ?Medications - No data to display ? ?Disposition Upon Discharge:  ?Condition: stable for discharge home ? ?Patient presented with concern for an acute illness with associated systemic symptoms and significant discomfort requiring urgent management. In my opinion, this is a condition that a prudent lay person (someone who possesses an average knowledge of health and medicine) may potentially expect to result in complications if not addressed urgently such as respiratory distress, impairment of bodily function or dysfunction of bodily organs.  ? ?As such, the patient has been evaluated and assessed, work-up was performed and treatment was provided in alignment with urgent care protocols and evidence based medicine.  Patient/parent/caregiver has been advised that the patient may require follow up for further testing and/or treatment if the symptoms continue in spite of treatment, as clinically indicated and appropriate. ? ?Routine symptom specific, illness specific and/or disease specific instructions were discussed with the patient and/or  caregiver at length.  Prevention strategies for avoiding STD exposure were also discussed. ? ?The patient will follow up with their current PCP if and as advised. If the patient does not currently have a PCP we wi

## 2022-03-07 NOTE — Discharge Instructions (Addendum)
Based on the history that you provided to me today, you were treated empirically for bacterial vaginitis with metronidazole 500 mg twice daily for the next 7 days.  I recommend that you abstain from sexual intercourse, tampon use while being treated. ?  ?Based on the history that you provided to me today, you were treated empirically for vaginal candidiasis with fluconazole (Diflucan), take the first tablet today and take the second tablet three days after the first tablet.  I provided you with a third tablet that you can choose to take 3 days after the second tablet or hold onto for a future episode.  Typically, these tablets are good for a year.  Please abstain from sexual intercourse and tampon use while being treated. ?  ?Your urine pregnancy test today is negative. ? ?The urinalysis that we performed in the clinic today revealed trace red blood cells and small amount of white blood cells which is consistent with vaginal yeast infection.   ?  ?Please let us know if your symptoms have not improved after completing treatment. ? ?Thank you for visiting urgent care today, it was a pleasure meeting you.   ? ?

## 2022-03-07 NOTE — ED Triage Notes (Signed)
Pt reports vag discharge and swelling to [facility] ?

## 2022-03-08 LAB — CERVICOVAGINAL ANCILLARY ONLY
Bacterial Vaginitis (gardnerella): NEGATIVE
Candida Glabrata: NEGATIVE
Candida Vaginitis: POSITIVE — AB
Chlamydia: NEGATIVE
Comment: NEGATIVE
Comment: NEGATIVE
Comment: NEGATIVE
Comment: NEGATIVE
Comment: NEGATIVE
Comment: NORMAL
Neisseria Gonorrhea: NEGATIVE
Trichomonas: NEGATIVE

## 2022-05-07 ENCOUNTER — Ambulatory Visit: Payer: Medicaid Other | Admitting: Family Medicine

## 2022-08-16 ENCOUNTER — Encounter: Payer: Self-pay | Admitting: Family Medicine

## 2022-08-16 ENCOUNTER — Ambulatory Visit (INDEPENDENT_AMBULATORY_CARE_PROVIDER_SITE_OTHER): Payer: Medicaid Other | Admitting: Family Medicine

## 2022-08-16 VITALS — BP 116/79 | HR 90 | Ht 62.0 in | Wt 186.8 lb

## 2022-08-16 DIAGNOSIS — M545 Low back pain, unspecified: Secondary | ICD-10-CM

## 2022-08-16 DIAGNOSIS — Z6834 Body mass index (BMI) 34.0-34.9, adult: Secondary | ICD-10-CM | POA: Diagnosis not present

## 2022-08-16 DIAGNOSIS — G8929 Other chronic pain: Secondary | ICD-10-CM

## 2022-08-16 DIAGNOSIS — F17200 Nicotine dependence, unspecified, uncomplicated: Secondary | ICD-10-CM | POA: Diagnosis not present

## 2022-08-16 NOTE — Assessment & Plan Note (Signed)
Elevated BMI. Has not has labs since 2009. Will obtain updated lipid panel, BMP, and CBC today to assess overall health status and risk.

## 2022-08-16 NOTE — Patient Instructions (Addendum)
It was great to see you today! Here's what we talked about:  For your back pain, I believe this is a chronic muscular problem. Try the Voltaren gel on your back as directed on the packaging. You can also use the heating pad on your back. Also use the stretches in the back book that I have attached. We will get labs today, and I will follow up those results with you.  Please let me know if you have any other questions.  Dr. Marcha Dutton

## 2022-08-16 NOTE — Assessment & Plan Note (Signed)
Stable. No neurological symptoms and overall reassuring exam. Point TTP over paraspinal muscles suggests muscular origin. Considerations include functional vs local nerve etiology given history of multiple epidurals. Counseled on using voltaren gel for local pain relief, a heating pad for relaxation of the muscles, and using the back book for more stretching exercises. If problem persists, can consider imaging in the future.

## 2022-08-16 NOTE — Assessment & Plan Note (Signed)
Continues to smoke cigars. Does not endorse willingness to stop today. Discussed we are here as resources for cessation should she want in the future.

## 2022-08-16 NOTE — Progress Notes (Addendum)
    SUBJECTIVE:   CHIEF COMPLAINT / HPI:  Back pain Had 4 epidurals with children. Has lower back pain since her first epidural in 1999 and last one in 2009. Constant, every day. Mild today at 5/10. A lot of times stretches can help, but it does not always help. Has not had pain in the legs. No numbness or tingling. Legs have never gave out on her. Now works as a Pharmacist, hospital. She is active at home cleaning up and picking up things that use her back. Has tried Tylenol which helped a little bit. Muscle relaxers have not helped. Has done ice packs which have helped.  Tobacco use Smokes cigars for over 20 years. 2 per day. Is not interested in stopping since it helps to relax her.  PERTINENT  PMH / PSH: muscle strain of R hip, eczema, tobacco  OBJECTIVE:   BP 116/79   Pulse 90   Ht 5\' 2"  (1.575 m)   Wt 186 lb 12.8 oz (84.7 kg)   LMP 07/30/2022   SpO2 100%   BMI 34.17 kg/m   General: Alert and oriented, in NAD Skin: Warm, dry without obvious lesions HEENT: NCAT, EOM grossly normal, midline nasal septum Cardiac: RRR, no m/r/g appreciated Respiratory: CTAB, breathing and speaking comfortably on RA Abdominal/Back: Nondistended abdomen, point tenderness to paraspinal muscles L>R along with some TTP of L scapula Extremities: Moves all extremities grossly equally Neurological: Strength and sensation intact throughout, ambulating well without difficulty Psychiatric: Appropriate mood and affect   ASSESSMENT/PLAN:   Chronic lower back pain Stable. No neurological symptoms and overall reassuring exam. Point TTP over paraspinal muscles suggests muscular origin. Considerations include functional vs local nerve etiology given history of multiple epidurals. Counseled on using voltaren gel for local pain relief, a heating pad for relaxation of the muscles, and using the back book for more stretching exercises. If problem persists, can consider imaging in the future.  TOBACCO USER Continues to smoke  cigars. Does not endorse willingness to stop today. Discussed we are here as resources for cessation should she want in the future.  BMI 34.0-34.9,adult Elevated BMI. Has not has labs since 2009. Will obtain updated lipid panel, BMP, and CBC today to assess overall health status and risk.   Ethelene Hal, MD Kim

## 2022-08-17 ENCOUNTER — Telehealth: Payer: Self-pay | Admitting: Family Medicine

## 2022-08-17 LAB — BASIC METABOLIC PANEL
BUN/Creatinine Ratio: 13 (ref 9–23)
BUN: 10 mg/dL (ref 6–24)
CO2: 23 mmol/L (ref 20–29)
Calcium: 8.9 mg/dL (ref 8.7–10.2)
Chloride: 103 mmol/L (ref 96–106)
Creatinine, Ser: 0.79 mg/dL (ref 0.57–1.00)
Glucose: 85 mg/dL (ref 70–99)
Potassium: 4 mmol/L (ref 3.5–5.2)
Sodium: 141 mmol/L (ref 134–144)
eGFR: 96 mL/min/{1.73_m2} (ref >59)

## 2022-08-17 LAB — CBC
Hematocrit: 36.5 % (ref 34.0–46.6)
Hemoglobin: 12.1 g/dL (ref 11.1–15.9)
MCH: 28.8 pg (ref 26.6–33.0)
MCHC: 33.2 g/dL (ref 31.5–35.7)
MCV: 87 fL (ref 79–97)
Platelets: 291 10*3/uL (ref 150–450)
RBC: 4.2 x10E6/uL (ref 3.77–5.28)
RDW: 13.6 % (ref 11.7–15.4)
WBC: 5.6 10*3/uL (ref 3.4–10.8)

## 2022-08-17 LAB — LIPID PANEL
Chol/HDL Ratio: 2.7 ratio (ref 0.0–4.4)
Cholesterol, Total: 134 mg/dL (ref 100–199)
HDL: 49 mg/dL (ref >39)
LDL Chol Calc (NIH): 68 mg/dL (ref 0–99)
Triglycerides: 90 mg/dL (ref 0–149)
VLDL Cholesterol Cal: 17 mg/dL (ref 5–40)

## 2022-08-17 NOTE — Telephone Encounter (Signed)
Called patient to discuss normal lab results. Patient appreciative without questions.

## 2024-01-02 ENCOUNTER — Ambulatory Visit: Payer: 59 | Attending: Nurse Practitioner | Admitting: Nurse Practitioner

## 2024-01-02 ENCOUNTER — Encounter: Payer: Self-pay | Admitting: Nurse Practitioner

## 2024-01-02 VITALS — BP 109/75 | HR 79 | Ht 62.0 in | Wt 186.6 lb

## 2024-01-02 DIAGNOSIS — D649 Anemia, unspecified: Secondary | ICD-10-CM

## 2024-01-02 DIAGNOSIS — Z1231 Encounter for screening mammogram for malignant neoplasm of breast: Secondary | ICD-10-CM

## 2024-01-02 DIAGNOSIS — Z7689 Persons encountering health services in other specified circumstances: Secondary | ICD-10-CM | POA: Diagnosis not present

## 2024-01-02 NOTE — Patient Instructions (Signed)
 [facility] Located in: [facility] Address: [address], [place], [place] [address] Phone: [phone]

## 2024-01-02 NOTE — Progress Notes (Signed)
 Assessment & Plan:  Sierra Strickland was seen today for new patient (initial visit).  Diagnoses and all orders for this visit:  Encounter to establish care  Anemia, unspecified type -     CMP14+EGFR -     CBC with Differential  Breast cancer screening by mammogram -     Cancel: MM 3D DIAGNOSTIC MAMMOGRAM BILATERAL BREAST; Future -     MM 3D SCREENING MAMMOGRAM BILATERAL BREAST; Future    Patient has been counseled on age-appropriate routine health concerns for screening and prevention. These are reviewed and up-to-date. Referrals have been placed accordingly. Immunizations are up-to-date or declined.    Subjective:   Chief Complaint  Patient presents with   New Patient (Initial Visit)    Sierra Strickland 43 y.o. female presents to office today to establish care.   She has a past medical history of BELLS PALSY (04/08/2009).   Endorses a history of breast cancer in both paternal aunts.   Patient has been counseled on age-appropriate routine health concerns for screening and prevention. These are reviewed and up-to-date. Referrals have been placed accordingly. Immunizations are up-to-date or declined.     MAMMOGRAM: OVERDUE. REFERRAL PLACED PAP SMEAR: UTD 09-2025   Blood pressure is well controlled.  BP Readings from Last 3 Encounters:  01/02/24 109/75  08/16/22 116/79  03/07/22 119/76     Review of Systems  Constitutional:  Negative for fever, malaise/fatigue and weight loss.  HENT: Negative.  Negative for nosebleeds.   Eyes: Negative.  Negative for blurred vision, double vision and photophobia.  Respiratory: Negative.  Negative for cough and shortness of breath.   Cardiovascular: Negative.  Negative for chest pain, palpitations and leg swelling.  Gastrointestinal: Negative.  Negative for heartburn, nausea and vomiting.  Musculoskeletal: Negative.  Negative for myalgias.  Neurological: Negative.  Negative for dizziness, focal weakness, seizures and headaches.   Psychiatric/Behavioral: Negative.  Negative for suicidal ideas.     Past Medical History:  Diagnosis Date   BELLS PALSY 04/08/2009   Qualifier: Diagnosis of  By: Leilani Punter  MD, Silver Dross      Past Surgical History:  Procedure Laterality Date   TUBAL LIGATION  2009    Family History  Family history unknown: Yes    Social History Reviewed with no changes to be made today.   Outpatient Medications Prior to Visit  Medication Sig Dispense Refill   fluconazole  (DIFLUCAN ) 150 MG tablet Take 1 tablet today.  Take second tablet 3 days later.  Take third tablet 3 days after second tablet. 3 tablet 0   No facility-administered medications prior to visit.    No Known Allergies     Objective:    BP 109/75 (BP Location: Right Arm, Patient Position: Sitting, Cuff Size: Normal)   Pulse 79   Ht 5\' 2"  (1.575 m)   Wt 186 lb 9.6 oz (84.6 kg)   LMP 12/20/2023 (Exact Date)   SpO2 100%   BMI 34.13 kg/m  Wt Readings from Last 3 Encounters:  01/02/24 186 lb 9.6 oz (84.6 kg)  08/16/22 186 lb 12.8 oz (84.7 kg)  10/13/21 182 lb 4 oz (82.7 kg)    Physical Exam Vitals and nursing note reviewed.  Constitutional:      Appearance: She is well-developed.  HENT:     Head: Normocephalic and atraumatic.  Cardiovascular:     Rate and Rhythm: Normal rate and regular rhythm.     Heart sounds: Normal heart sounds. No murmur heard.    No friction  rub. No gallop.  Pulmonary:     Effort: Pulmonary effort is normal. No tachypnea or respiratory distress.     Breath sounds: Normal breath sounds. No decreased breath sounds, wheezing, rhonchi or rales.  Chest:     Chest wall: No tenderness.  Abdominal:     General: Bowel sounds are normal.     Palpations: Abdomen is soft.  Musculoskeletal:        General: Normal range of motion.     Cervical back: Normal range of motion.  Skin:    General: Skin is warm and dry.  Neurological:     Mental Status: She is alert and oriented to person, place, and time.      Coordination: Coordination normal.  Psychiatric:        Behavior: Behavior normal. Behavior is cooperative.        Thought Content: Thought content normal.        Judgment: Judgment normal.          Patient has been counseled extensively about nutrition and exercise as well as the importance of adherence with medications and regular follow-up. The patient was given clear instructions to go to ER or return to medical center if symptoms don't improve, worsen or new problems develop. The patient verbalized understanding.   Follow-up: Return for physical 2-3 months .   Collins Dean, FNP-BC [facility] [place], [place] [phone]   01/02/2024, 9:44 PM

## 2024-01-03 LAB — CBC WITH DIFFERENTIAL/PLATELET
Basophils Absolute: 0 10*3/uL (ref 0.0–0.2)
Basos: 1 %
EOS (ABSOLUTE): 0.2 10*3/uL (ref 0.0–0.4)
Eos: 4 %
Hematocrit: 36.3 % (ref 34.0–46.6)
Hemoglobin: 11.8 g/dL (ref 11.1–15.9)
Immature Grans (Abs): 0 10*3/uL (ref 0.0–0.1)
Immature Granulocytes: 0 %
Lymphocytes Absolute: 1.9 10*3/uL (ref 0.7–3.1)
Lymphs: 35 %
MCH: 27.2 pg (ref 26.6–33.0)
MCHC: 32.5 g/dL (ref 31.5–35.7)
MCV: 84 fL (ref 79–97)
Monocytes Absolute: 0.5 10*3/uL (ref 0.1–0.9)
Monocytes: 8 %
Neutrophils Absolute: 2.9 10*3/uL (ref 1.4–7.0)
Neutrophils: 52 %
Platelets: 328 10*3/uL (ref 150–450)
RBC: 4.34 x10E6/uL (ref 3.77–5.28)
RDW: 15.2 % (ref 11.7–15.4)
WBC: 5.5 10*3/uL (ref 3.4–10.8)

## 2024-01-03 LAB — CMP14+EGFR
ALT: 10 [IU]/L (ref 0–32)
AST: 13 [IU]/L (ref 0–40)
Albumin: 4.2 g/dL (ref 3.9–4.9)
Alkaline Phosphatase: 64 [IU]/L (ref 44–121)
BUN/Creatinine Ratio: 13 (ref 9–23)
BUN: 10 mg/dL (ref 6–24)
Bilirubin Total: <0.2 mg/dL (ref 0.0–1.2)
CO2: 23 mmol/L (ref 20–29)
Calcium: 8.7 mg/dL (ref 8.7–10.2)
Chloride: 105 mmol/L (ref 96–106)
Creatinine, Ser: 0.78 mg/dL (ref 0.57–1.00)
Globulin, Total: 2.4 g/dL (ref 1.5–4.5)
Glucose: 86 mg/dL (ref 70–99)
Potassium: 4.1 mmol/L (ref 3.5–5.2)
Sodium: 142 mmol/L (ref 134–144)
Total Protein: 6.6 g/dL (ref 6.0–8.5)
eGFR: 97 mL/min/{1.73_m2} (ref >59)

## 2024-03-07 ENCOUNTER — Ambulatory Visit
Admission: RE | Admit: 2024-03-07 | Discharge: 2024-03-07 | Disposition: A | Discharge status: Home / Self Care | Source: Ambulatory Visit | Attending: Nurse Practitioner | Admitting: Nurse Practitioner

## 2024-03-07 ENCOUNTER — Ambulatory Visit

## 2024-03-07 DIAGNOSIS — Z1231 Encounter for screening mammogram for malignant neoplasm of breast: Secondary | ICD-10-CM

## 2025-01-23 ENCOUNTER — Encounter: Payer: Self-pay | Admitting: Obstetrics and Gynecology
